# Patient Record
Sex: Female | Born: 1965 | Race: White | Hispanic: No | Marital: Married | Smoking: Former smoker
Health system: Southern US, Community
[De-identification: ages and names within clinical notes are randomized; demographics above are authoritative.]

## PROBLEM LIST (undated history)

## (undated) DIAGNOSIS — B001 Herpesviral vesicular dermatitis: Secondary | ICD-10-CM

## (undated) DIAGNOSIS — N393 Stress incontinence (female) (male): Secondary | ICD-10-CM

## (undated) HISTORY — DX: Herpesviral vesicular dermatitis: B00.1

## (undated) HISTORY — PX: TUBAL LIGATION: SHX77

## (undated) HISTORY — DX: Stress incontinence (female) (male): N39.3

## (undated) HISTORY — PX: BREAST CYST ASPIRATION: SHX578

## (undated) HISTORY — PX: ABDOMINAL HYSTERECTOMY: SHX81

---

## 2008-06-16 ENCOUNTER — Ambulatory Visit: Payer: Self-pay | Admitting: Family Medicine

## 2008-06-26 ENCOUNTER — Ambulatory Visit: Payer: Self-pay | Admitting: Family Medicine

## 2009-10-01 ENCOUNTER — Ambulatory Visit: Payer: Self-pay | Admitting: Family Medicine

## 2009-10-08 ENCOUNTER — Ambulatory Visit: Payer: Self-pay | Admitting: Family Medicine

## 2009-10-08 ENCOUNTER — Ambulatory Visit: Payer: Self-pay | Admitting: Unknown Physician Specialty

## 2009-10-30 ENCOUNTER — Ambulatory Visit: Payer: Self-pay | Admitting: Unknown Physician Specialty

## 2009-11-04 ENCOUNTER — Ambulatory Visit: Payer: Self-pay | Admitting: Unknown Physician Specialty

## 2010-04-04 HISTORY — PX: TUBAL LIGATION: SHX77

## 2010-09-21 ENCOUNTER — Ambulatory Visit: Payer: Self-pay | Admitting: Gastroenterology

## 2011-02-03 ENCOUNTER — Ambulatory Visit: Payer: Self-pay | Admitting: Family Medicine

## 2012-02-07 ENCOUNTER — Ambulatory Visit: Payer: Self-pay | Admitting: Family Medicine

## 2012-11-26 ENCOUNTER — Ambulatory Visit (INDEPENDENT_AMBULATORY_CARE_PROVIDER_SITE_OTHER): Payer: BC Managed Care – PPO | Admitting: Surgery

## 2012-11-26 ENCOUNTER — Telehealth (INDEPENDENT_AMBULATORY_CARE_PROVIDER_SITE_OTHER): Payer: Self-pay | Admitting: Surgery

## 2012-11-26 ENCOUNTER — Encounter (INDEPENDENT_AMBULATORY_CARE_PROVIDER_SITE_OTHER): Payer: Self-pay | Admitting: Surgery

## 2012-11-26 VITALS — BP 128/80 | HR 80 | Temp 98.2°F | Resp 14 | Ht 64.0 in | Wt 141.4 lb

## 2012-11-26 DIAGNOSIS — K648 Other hemorrhoids: Secondary | ICD-10-CM

## 2012-11-26 DIAGNOSIS — K644 Residual hemorrhoidal skin tags: Secondary | ICD-10-CM | POA: Insufficient documentation

## 2012-11-26 NOTE — Progress Notes (Signed)
Subjective:     Patient ID: Kathryn Guzman, female   DOB: 06-05-1965, 47 y.o.   MRN: [id number]  HPI  Kathryn Guzman  1965/09/08 [id number]  Patient has no care team.  This patient is a 47 y.o.female who presents today for surgical evaluation at the request of self.   Reason for visit: Painful hemorrhoids  Pleasant woman.  History with hemorrhoids since her last pregnancy 14 years ago.  She has many external hemorrhoids.  It is hard to keep the skin clean.  Itching and irritation.  Has been given Proctofoam in the past that is helped a little bit.  She also gets swelling and bleeding of internal hemorrhoids.  Sometimes bowel and can be very painful.  She normally has a bowel movement 2-3 times a day.  Had a colonoscopy five years ago to a sister having stomach/gastrointestinal cancer.  That was normal.  No personal nor family history of GI/colon cancer, inflammatory bowel disease, irritable bowel syndrome, allergy such as Celiac Sprue, dietary/dairy problems, colitis, ulcers nor gastritis.  No recent sick contacts/gastroenteritis.  No travel outside the country.  No changes in diet.  She thinks it has worsened.  It is becoming more debilitating.  She came in to have something done  There are no active problems to display for this patient.   History reviewed. No pertinent past medical history.  Past Surgical History  Procedure Laterality Date  . Tubal ligation  2012    History   Social History  . Marital Status: Married    Spouse Name: N/A    Number of Children: N/A  . Years of Education: N/A   Occupational History  . Not on file.   Social History Main Topics  . Smoking status: Former Smoker    Quit date: 04/05/2007  . Smokeless tobacco: Never Used  . Alcohol Use: No  . Drug Use: No  . Sexual Activity: Not on file   Other Topics Concern  . Not on file   Social History Narrative  . No narrative on file    Family History  Problem Relation Age of Onset  . Cancer  Mother     breast  . Cancer Sister   . Birth defects Sister     stomach    No current outpatient prescriptions on file.   No current facility-administered medications for this visit.     Not on File  BP 128/80  Pulse 80  Temp(Src) 98.2 F (36.8 C) (Temporal)  Resp 14  Ht 5\' 4"  (1.626 m)  Wt 141 lb 6.4 oz (64.139 kg)  BMI 24.26 kg/m2  No results found. '  Review of Systems  Constitutional: Negative for fever, chills, diaphoresis, appetite change and fatigue.  HENT: Negative for ear pain, sore throat, trouble swallowing, neck pain and ear discharge.   Eyes: Negative for photophobia, discharge and visual disturbance.  Respiratory: Negative for cough, choking, chest tightness and shortness of breath.   Cardiovascular: Negative for chest pain and palpitations.  Gastrointestinal: Negative for nausea, vomiting, abdominal pain, diarrhea, constipation, anal bleeding and rectal pain.  Endocrine: Negative for cold intolerance and heat intolerance.  Genitourinary: Negative for dysuria, frequency and difficulty urinating.  Musculoskeletal: Negative for myalgias and gait problem.  Skin: Negative for color change, pallor and rash.  Allergic/Immunologic: Negative for environmental allergies, food allergies and immunocompromised state.  Neurological: Negative for dizziness, speech difficulty, weakness and numbness.  Hematological: Negative for adenopathy.  Psychiatric/Behavioral: Negative for confusion and agitation. The patient is not nervous/anxious.  Objective:   Physical Exam  Constitutional: She is oriented to person, place, and time. She appears well-developed and well-nourished. No distress.  HENT:  Head: Normocephalic.  Mouth/Throat: Oropharynx is clear and moist. No oropharyngeal exudate.  Eyes: Conjunctivae and EOM are normal. Pupils are equal, round, and reactive to light. No scleral icterus.  Neck: Normal range of motion. Neck supple. No tracheal deviation present.   Cardiovascular: Normal rate, regular rhythm and intact distal pulses.   Pulmonary/Chest: Effort normal and breath sounds normal. No stridor. No respiratory distress. She exhibits no tenderness.  Abdominal: Soft. She exhibits no distension and no mass. There is no tenderness. Hernia confirmed negative in the right inguinal area and confirmed negative in the left inguinal area.  Genitourinary: There is no rash or tenderness on the right labia. There is no rash or tenderness on the left labia. No vaginal discharge found.  Exam done with assistance of female Medical Assistant in the room.  Perianal skin clean with good hygiene.  No pruritis.  No pilonidal disease.  No fissure.  No abscess/fistula.    Moderate external hemorrhoids right anterior and left lateral.  Very sensitive and irritated.  Tolerates digital  and anoscopic rectal exam.  Normal sphincter tone.  No rectal masses.  Hemorrhoidal piles Enlarged x3 piles.  Right posterior = right anterior > left lateral  Musculoskeletal: Normal range of motion. She exhibits no tenderness.       Right elbow: She exhibits normal range of motion.       Left elbow: She exhibits normal range of motion.       Right wrist: She exhibits normal range of motion.       Left wrist: She exhibits normal range of motion.       Right hand: Normal strength noted.       Left hand: Normal strength noted.  Lymphadenopathy:       Head (right side): No posterior auricular adenopathy present.       Head (left side): No posterior auricular adenopathy present.    She has no cervical adenopathy.    She has no axillary adenopathy.       Right: No inguinal adenopathy present.       Left: No inguinal adenopathy present.  Neurological: She is alert and oriented to person, place, and time. No cranial nerve deficit. She exhibits normal muscle tone. Coordination normal.  Skin: Skin is warm and dry. No rash noted. She is not diaphoretic. No erythema.  Psychiatric: She has a normal  mood and affect. Her behavior is normal. Judgment and thought content normal.       Assessment:     Internal hemorrhoids with intermittent bleeding and prolapse.  External hemorrhoids with irritation and pain     Plan:     Half of the patient several options.  We could start with banding to see if the internal hemorrhoidal calm down enough.  Another option is to do [facility] ligation/pexy with resection of remaining external hemorrhoids.  She notes the external hemorrhoids are the most frustrating aspect of it and wished to be more aggressive with surgical removal and ligation.  I discussed this with her:   The anatomy & physiology of the anorectal region was discussed.  The pathophysiology of hemorrhoids and differential diagnosis was discussed.  Natural history risks without surgery was discussed.   I stressed the importance of a bowel regimen to have daily soft bowel movements to minimize progression of disease.  Interventions such as sclerotherapy &  banding were discussed.  The patient's symptoms are not adequately controlled by medicines and other non-operative treatments.  I feel the risks & problems of no surgery outweigh the operative risks; therefore, I recommended surgery to treat the hemorrhoids by ligation, pexy, and possible resection.  Risks such as bleeding, infection, need for further treatment, heart attack, death, and other risks were discussed.   I noted a good likelihood this will help address the problem.  Goals of post-operative recovery were discussed as well.  Possibility that this will not correct all symptoms was explained.  Post-operative pain, bleeding, constipation, and other problems after surgery were discussed.  We will work to minimize complications.   Educational handouts further explaining the pathology, treatment options, and bowel regimen were given as well.  Questions were answered.  The patient expresses understanding & wishes to proceed with surgery.

## 2012-11-26 NOTE — Patient Instructions (Addendum)
See the Handout(s) we gave you.  Consider surgery.  Please call our office at [phone] if you wish to schedule surgery or if you have further questions / concerns.   HEMORRHOIDS  The rectum is the last foot of your colon, and it naturally stretches to hold stool.  Hemorrhoidal piles are natural clusters of blood vessels that help the rectum and anal canal stretch to hold stool and allow bowel movements to eliminate feces.   Hemorrhoids are abnormally swollen blood vessels in the rectum.  Too much pressure in the rectum causes hemorrhoids by forcing blood to stretch and bulge the walls of the veins, sometimes even rupturing them.  Hemorrhoids can become like varicose veins you might see on a person's legs.  Most people will develop a flare of hemorrhoids in their lifetime.  When bulging hemorrhoidal veins are irritated, they can swell, burn, itch, cause pain, and bleed.  Most flares will calm down gradually own within a few weeks.  However, once hemorrhoids are created, they are difficult to get rid of completely and tend to flare more easily than the first flare.   Fortunately, good habits and simple medical treatment usually control hemorrhoids well, and surgery is needed only in severe cases. Types of Hemorrhoids:  Internal hemorrhoids usually don't initially hurt or itch; they are deep inside the rectum and usually have no sensation. If they begin to push out (prolapse), pain and burning can occur.  However, internal hemorrhoids can bleed.  Anal bleeding should not be ignored since bleeding could come from a dangerous source like colorectal cancer, so persistent rectal bleeding should be investigated by a doctor, sometimes with a colonoscopy.  External hemorrhoids cause most of the symptoms - pain, burning, and itching. Nonirritated hemorrhoids can look like small skin tags coming out of the anus.   Thrombosed hemorrhoids can form when a hemorrhoid blood vessel bursts and causes the hemorrhoid to  suddenly swell.  A purple blood clot can form in it and become an excruciatingly painful lump at the anus. Because of these unpleasant symptoms, immediate incision and drainage by a surgeon at an office visit can provide much relief of the pain.    PREVENTION Avoiding the most frequent causes listed below will prevent most cases of hemorrhoids: Constipation Hard stools Diarrhea  Constant sitting  Straining with bowel movements Sitting on the toilet for a long time  Severe coughing  episodes Pregnancy / Childbirth  Heavy Lifting  Sometimes avoiding the above triggers is difficult:  How can you avoid sitting all day if you have a seated job? Also, we try to avoid coughing and diarrhea, but sometimes it's beyond your control.  Still, there are some practical hints to help: Keep the anal and genital area clean.  Moistened tissues such as flushable wet wipes are less irritating than toilet paper.  Using irrigating showers or bottle irrigation washing gently cleans this sensitive area.   Avoid dry toilet paper when cleaning after bowel movements.  Marland Kitchen Keep the anal and genital area dry.  Lightly pat the rectal area dry.  Avoid rubbing.  Talcum or baby powders can help GET YOUR STOOLS SOFT.   This is the most important way to prevent irritated hemorrhoids.  Hard stools are like sandpaper to the anorectal canal and will cause more problems.  The goal: ONE SOFT BOWEL MOVEMENT A DAY!  BMs from every other day to 3 times a day is a tolerable range Treat coughing, diarrhea and constipation early since irritated  hemorrhoids may soon follow.  If your main job activity is seated, always stand or walk during your breaks. Make it a point to stand and walk at least 5 minutes every hour and try to shift frequently in your chair to avoid direct rectal pressure.  Always exhale as you strain or lift. Don't hold your breath.  Do not delay or try to prevent a bowel movement when the urge is present. Exercise regularly  (walking or jogging 60 minutes a day) to stimulate the bowels to move. No reading or other activity while on the toilet. If bowel movements take longer than 5 minutes, you are too constipated. AVOID CONSTIPATION Drink plenty of liquids (1 1/2 to 2 quarts of water and other fluids a day unless fluid restricted for another medical condition). Liquids that contain caffeine (coffee a, tea, soft drinks) can be dehydrating and should be avoided until constipation is controlled. Consider minimizing milk, as dairy products may be constipating. Eat plenty of fiber (30g a day ideal, more if needed).  Fiber is the undigested part of plant food that passes into the colon, acting as "natures broom" to encourage bowel motility and movement.  Fiber can absorb and hold large amounts of water. This results in a larger, bulkier stool, which is soft and easier to pass.  Eating foods high in fiber - 12 servings - such as  Vegetables: Root (potatoes, carrots, turnips), Leafy green (lettuce, salad greens, celery, spinach), High residue (cabbage, broccoli, etc.) Fruit: Fresh, Dried (prunes, apricots, cherries), Stewed (applesauce)  Whole grain breads, pasta, whole wheat Bran cereals, muffins, etc. Consider adding supplemental bulking fiber which retains large volumes of water: Psyllium ground seeds (native plant from [place])--available as Metamucil, Konsyl, Effersyllium, Per Diem Fiber, or the less expensive generic forms.  Citrucel  (methylcellulose wood fiber) . FiberCon (Polycarbophil) Polyethylene Glycol - and "artificial" fiber commonly called Miralax or Glycolax.  It is helpful for people with gassy or bloated feelings with regular fiber Flax Seed - a less gassy natural fiber  Laxatives can be useful for a short period if constipation is severe Osmotics (Milk of Magnesia, Fleets Phospho-Soda, Magnesium Citrate)  Stimulants (Senokot,   Castor Oil,  Dulcolax, Ex-Lax)    Laxatives are not a good long-term  solution as it can stress the bowels and cause too much mineral loss and dehydration.   Avoid taking laxatives for more than 7 days in a row.  AVOID DIARRHEA Switch to liquids and simpler foods for a few days to avoid stressing your intestines further. Avoid dairy products (especially milk & ice cream) for a short time.  The intestines often can lose the ability to digest lactose when stressed. Avoid foods that cause gassiness or bloating.  Typical foods include beans and other legumes, cabbage, broccoli, and dairy foods.  Every person has some sensitivity to other foods, so listen to your body and avoid those foods that trigger problems for you. Adding fiber (Citrucel, Metamucil, FiberCon, Flax seed, Miralax) gradually can help thicken stools by absorbing excess fluid and retrain the intestines to act more normally.  Slowly increase the dose over a few weeks.  Too much fiber too soon can backfire and cause cramping & bloating. Probiotics (such as active yogurt, Align, etc) may help repopulate the intestines and colon with normal bacteria and calm down a sensitive digestive tract.  Most studies show it to be of mild help, though, and such products can be costly. Medicines: Bismuth subsalicylate (ex. Kayopectate, Pepto Bismol) every 30  minutes for up to 6 doses can help control diarrhea.  Avoid if pregnant. Loperamide (Immodium) can slow down diarrhea.  Start with two tablets (4mg  total) first and then try one tablet every 6 hours.  Avoid if you are having fevers or severe pain.  If you are not better or start feeling worse, stop all medicines and call your doctor for advice Call your doctor if you are getting worse or not better.  Sometimes further testing (cultures, endoscopy, X-ray studies, bloodwork, etc) may be needed to help diagnose and treat the cause of the diarrhea. TREATMENT OF HEMORRHOID FLARE If these preventive measures fail, you must take action right away! Hemorrhoids are one condition  that can be mild in the morning and become intolerable by nightfall. Most hemorrhoidal flares take several weeks to calm down.  These suggestions can help: Warm soaks.  This helps more than any topical medication.  Use up to 8 times a day.  Usually sitz baths or sitting in a warm bathtub helps.  Sitting on moist warm towels are helpful.  Switching to ice packs/cool compresses can be helpful Normalize your bowels.  Extremes of diarrhea or constipation will make hemorrhoids worse.  One soft bowel movement a day is the goal.  Fiber can help get your bowels regular Wet wipes instead of toilet paper Pain control with a NSAID such as ibuprofen (Advil) or naproxen (Aleve) or acetaminophen (Tylenol) around the clock.  Narcotics are constipating and should be minimized if possible Topical creams contain steroids (bydrocortisone) or local anesthetic (xylocaine) can help make pain and itching more tolerable.   EVALUATION If hemorrhoids are still causing problems, you could benefit by an evaluation by a surgeon.  The surgeon will obtain a history and examine you.  If hemorrhoids are diagnosed, some therapies can be offered in the office, usually with an anoscope into the less sensitive area of the rectum: -injection of hemorrhoids (sclerotherapy) can scar the blood vessels of the swollen/enlarged hemorrhoids to help shrink them down to a more normal size -rubber banding of the enlarged hemorrhoids to help shrink them down to a more normal size -drainage of the blood clot causing a thrombosed hemorrhoid,  to relieve the severe pain   While 90% of the time such problems from hemorrhoids can be managed without preceding to surgery, sometimes the hemorrhoids require a operation to control the problem (uncontrolled bleeding, prolapse, pain, etc.).   This involves being placed under general anesthesia where the surgeon can confirm the diagnosis and remove, suture, or staple the hemorrhoid(s).  Your surgeon can help you  treat the problem appropriately.    ANORECTAL SURGERY: POST OP INSTRUCTIONS  1. Take your usually prescribed home medications unless otherwise directed. 2. DIET: Follow a light bland diet the first 24 hours after arrival home, such as soup, liquids, crackers, etc.  Be sure to include lots of fluids daily.  Avoid fast food or heavy meals as your are more likely to get nauseated.  Eat a low fat the next few days after surgery.   3. PAIN CONTROL: a. Pain is best controlled by a usual combination of three different methods TOGETHER: i. Ice/Heat ii. Over the counter pain medication iii. Prescription pain medication b. Most patients will experience some swelling and discomfort in the anus/rectal area. and incisions.  Ice packs or heat (30-60 minutes up to 6 times a day) will help. Use ice for the first few days to help decrease swelling and bruising, then switch to heat such  as warm towels, sitz baths, warm baths, etc to help relax tight/sore spots and speed recovery.  Some people prefer to use ice alone, heat alone, alternating between ice & heat.  Experiment to what works for you.  Swelling and bruising can take several weeks to resolve.   c. It is helpful to take an over-the-counter pain medication regularly for the first few weeks.  Choose one of the following that works best for you: i. Naproxen (Aleve, etc)  Two 220mg  tabs twice a day ii. Ibuprofen (Advil, etc) Three 200mg  tabs four times a day (every meal & bedtime) iii. Acetaminophen (Tylenol, etc) 500-650mg  four times a day (every meal & bedtime) d. A  prescription for pain medication (such as oxycodone, hydrocodone, etc) should be given to you upon discharge.  Take your pain medication as prescribed.  i. If you are having problems/concerns with the prescription medicine (does not control pain, nausea, vomiting, rash, itching, etc), please call us [phone] to see if we need to switch you to a different pain medicine that will work better  for you and/or control your side effect better. ii. If you need a refill on your pain medication, please contact your pharmacy.  They will contact our office to request authorization. Prescriptions will not be filled after 5 pm or on week-ends. 4. KEEP YOUR BOWELS REGULAR a. The goal is one bowel movement a day b. Avoid getting constipated.  Between the surgery and the pain medications, it is common to experience some constipation.  Increasing fluid intake and taking a fiber supplement (such as Metamucil, Citrucel, FiberCon, MiraLax, etc) 1-2 times a day regularly will usually help prevent this problem from occurring.  A mild laxative (prune juice, Milk of Magnesia, MiraLax, etc) should be taken according to package directions if there are no bowel movements after 48 hours. c. Watch out for diarrhea.  If you have many loose bowel movements, simplify your diet to bland foods & liquids for a few days.  Stop any stool softeners and decrease your fiber supplement.  Switching to mild anti-diarrheal medications (Kayopectate, Pepto Bismol) can help.  If this worsens or does not improve, please call us.  5. Wound Care a. Remove your bandages the day after surgery.  Unless discharge instructions indicate otherwise, leave your bandage dry and in place overnight.  Remove the bandage during your first bowel movement.   b. Allow the wound packing to fall out over the next few days.  You can trim exposed gauze / ribbon as it falls out.  You do not need to repack the wound unless instructed otherwise.  Wear an absorbent pad or soft cotton gauze in your underwear as needed to catch any drainage and help keep the area  c. Keep the area clean and dry.  Bathe / shower every day.  Keep the area clean by showering / bathing over the incision / wound.   It is okay to soak an open wound to help wash it.  Wet wipes or showers / gentle washing after bowel movements is often less traumatic than regular toilet paper. d. Bonita Quin may  have some styrofoam-like soft packing in the rectum which will come out with the first bowel movement.  e. You will often notice bleeding with bowel movements.  This should slow down by the end of the first week of surgery f. Expect some drainage.  This should slow down, too, by the end of the first week of surgery.  Wear an absorbent pad  or soft cotton gauze in your underwear until the drainage stops. 6. ACTIVITIES as tolerated:   a. You may resume regular (light) daily activities beginning the next day-such as daily self-care, walking, climbing stairs-gradually increasing activities as tolerated.  If you can walk 30 minutes without difficulty, it is safe to try more intense activity such as jogging, treadmill, bicycling, low-impact aerobics, swimming, etc. b. Save the most intensive and strenuous activity for last such as sit-ups, heavy lifting, contact sports, etc  Refrain from any heavy lifting or straining until you are off narcotics for pain control.   c. DO NOT PUSH THROUGH PAIN.  Let pain be your guide: If it hurts to do something, don't do it.  Pain is your body warning you to avoid that activity for another week until the pain goes down. d. You may drive when you are no longer taking prescription pain medication, you can comfortably sit for long periods of time, and you can safely maneuver your car and apply brakes. e. Bonita Quin may have sexual intercourse when it is comfortable.  7. FOLLOW UP in our office a. Please call [facility] at [phone] to set up an appointment to see your surgeon in the office for a follow-up appointment approximately 2 weeks after your surgery. b. Make sure that you call for this appointment the day you arrive home to insure a convenient appointment time. 10. IF YOU HAVE DISABILITY OR FAMILY LEAVE FORMS, BRING THEM TO THE OFFICE FOR PROCESSING.  DO NOT GIVE THEM TO YOUR DOCTOR.        WHEN TO CALL us [phone]: 1. Poor pain control 2. Reactions / problems with  new medications (rash/itching, nausea, etc)  3. Fever over 101.5 F (38.5 C) 4. Inability to urinate 5. Nausea and/or vomiting 6. Worsening swelling or bruising 7. Continued bleeding from incision. 8. Increased pain, redness, or drainage from the incision  The clinic staff is available to answer your questions during regular business hours (8:30am-5pm).  Please don't hesitate to call and ask to speak to one of our nurses for clinical concerns.   A surgeon from [facility] is always on call at the hospitals   If you have a medical emergency, go to the nearest emergency room or call 911.    [facility] [address], [place], [place]  [address] ? MAIN: [phone] ? TOLL FREE: [phone] ? FAX [phone] [email or web address]  GETTING TO GOOD BOWEL HEALTH. Irregular bowel habits such as constipation and diarrhea can lead to many problems over time.  Having one soft bowel movement a day is the most important way to prevent further problems.  The anorectal canal is designed to handle stretching and feces to safely manage our ability to get rid of solid waste (feces, poop, stool) out of our body.  BUT, hard constipated stools can act like ripping concrete bricks and diarrhea can be a burning fire to this very sensitive area of our body, causing inflamed hemorrhoids, anal fissures, increasing risk is perirectal abscesses, abdominal pain/bloating, an making irritable bowel worse.     The goal: ONE SOFT BOWEL MOVEMENT A DAY!  To have soft, regular bowel movements:    Drink at least 8 tall glasses of water a day.     Take plenty of fiber.  Fiber is the undigested part of plant food that passes into the colon, acting s "natures broom" to encourage bowel motility and movement.  Fiber can absorb and  hold large amounts of water. This results in a larger, bulkier stool, which is soft and easier to pass. Work gradually over several weeks up to  6 servings a day of fiber (25g a day even more if needed) in the form of: o Vegetables -- Root (potatoes, carrots, turnips), leafy green (lettuce, salad greens, celery, spinach), or cooked high residue (cabbage, broccoli, etc) o Fruit -- Fresh (unpeeled skin & pulp), Dried (prunes, apricots, cherries, etc ),  or stewed ( applesauce)  o Whole grain breads, pasta, etc (whole wheat)  o Bran cereals    Bulking Agents -- This type of water-retaining fiber generally is easily obtained each day by one of the following:  o Psyllium bran -- The psyllium plant is remarkable because its ground seeds can retain so much water. This product is available as Metamucil, Konsyl, Effersyllium, Per Diem Fiber, or the less expensive generic preparation in drug and health food stores. Although labeled a laxative, it really is not a laxative.  o Methylcellulose -- This is another fiber derived from wood which also retains water. It is available as Citrucel. o Polyethylene Glycol - and "artificial" fiber commonly called Miralax or Glycolax.  It is helpful for people with gassy or bloated feelings with regular fiber o Flax Seed - a less gassy fiber than psyllium   No reading or other relaxing activity while on the toilet. If bowel movements take longer than 5 minutes, you are too constipated   AVOID CONSTIPATION.  High fiber and water intake usually takes care of this.  Sometimes a laxative is needed to stimulate more frequent bowel movements, but    Laxatives are not a good long-term solution as it can wear the colon out. o Osmotics (Milk of Magnesia, Fleets phosphosoda, Magnesium citrate, MiraLax, GoLytely) are safer than  o Stimulants (Senokot, Castor Oil, Dulcolax, Ex Lax)    o Do not take laxatives for more than 7days in a row.    IF SEVERELY CONSTIPATED, try a Bowel Retraining Program: o Do not use laxatives.  o Eat a diet high in roughage, such as bran cereals and leafy vegetables.  o Drink six (6) ounces of prune  or apricot juice each morning.  o Eat two (2) large servings of stewed fruit each day.  o Take one (1) heaping tablespoon of a psyllium-based bulking agent twice a day. Use sugar-free sweetener when possible to avoid excessive calories.  o Eat a normal breakfast.  o Set aside 15 minutes after breakfast to sit on the toilet, but do not strain to have a bowel movement.  o If you do not have a bowel movement by the third day, use an enema and repeat the above steps.    Controlling diarrhea o Switch to liquids and simpler foods for a few days to avoid stressing your intestines further. o Avoid dairy products (especially milk & ice cream) for a short time.  The intestines often can lose the ability to digest lactose when stressed. o Avoid foods that cause gassiness or bloating.  Typical foods include beans and other legumes, cabbage, broccoli, and dairy foods.  Every person has some sensitivity to other foods, so listen to our body and avoid those foods that trigger problems for you. o Adding fiber (Citrucel, Metamucil, psyllium, Miralax) gradually can help thicken stools by absorbing excess fluid and retrain the intestines to act more normally.  Slowly increase the dose over a few weeks.  Too much fiber too soon can backfire and cause  cramping & bloating. o Probiotics (such as active yogurt, Align, etc) may help repopulate the intestines and colon with normal bacteria and calm down a sensitive digestive tract.  Most studies show it to be of mild help, though, and such products can be costly. o Medicines:   Bismuth subsalicylate (ex. Kayopectate, Pepto Bismol) every 30 minutes for up to 6 doses can help control diarrhea.  Avoid if pregnant.   Loperamide (Immodium) can slow down diarrhea.  Start with two tablets (4mg  total) first and then try one tablet every 6 hours.  Avoid if you are having fevers or severe pain.  If you are not better or start feeling worse, stop all medicines and call your doctor for  advice o Call your doctor if you are getting worse or not better.  Sometimes further testing (cultures, endoscopy, X-ray studies, bloodwork, etc) may be needed to help diagnose and treat the cause of the diarrhea. o

## 2012-11-26 NOTE — Telephone Encounter (Signed)
Met with surgery scheduling, went over financial responsibilities, face sheet in pending folder.

## 2013-03-17 IMAGING — MG MM CAD DIAGNOSTIC MAMMO
1 series · 6 of 6 positions shown · non-contrast
Comparison: none

REASON FOR EXAM: fu cat 4 nodule and yealry
COMMENTS:

[Series 5390: R CC · right · 6 of 6 slices shown]
[im 1/6]
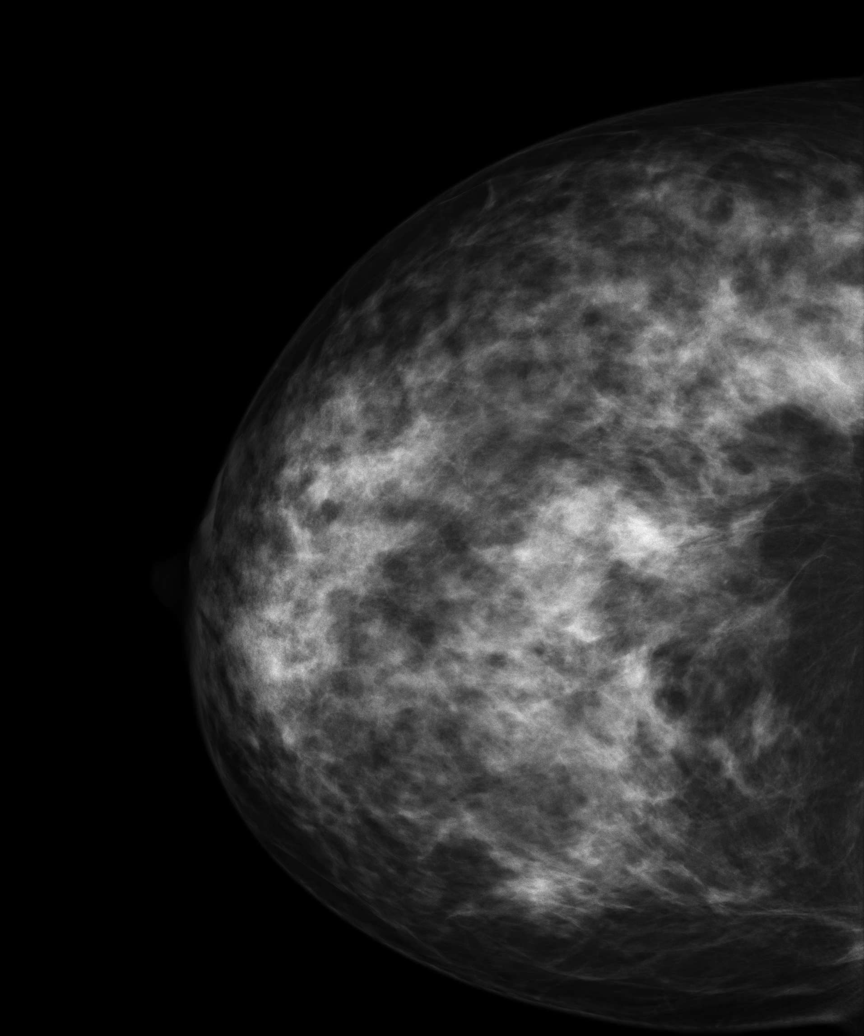
[im 2/6]
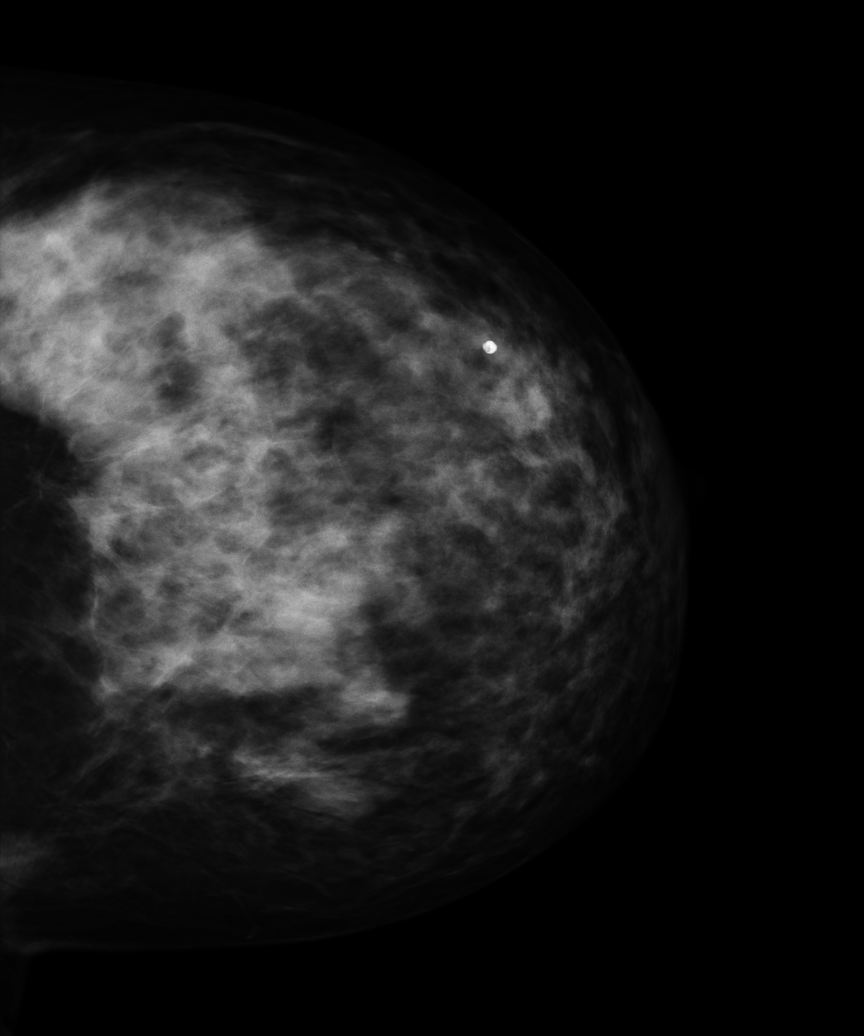
[im 3/6]
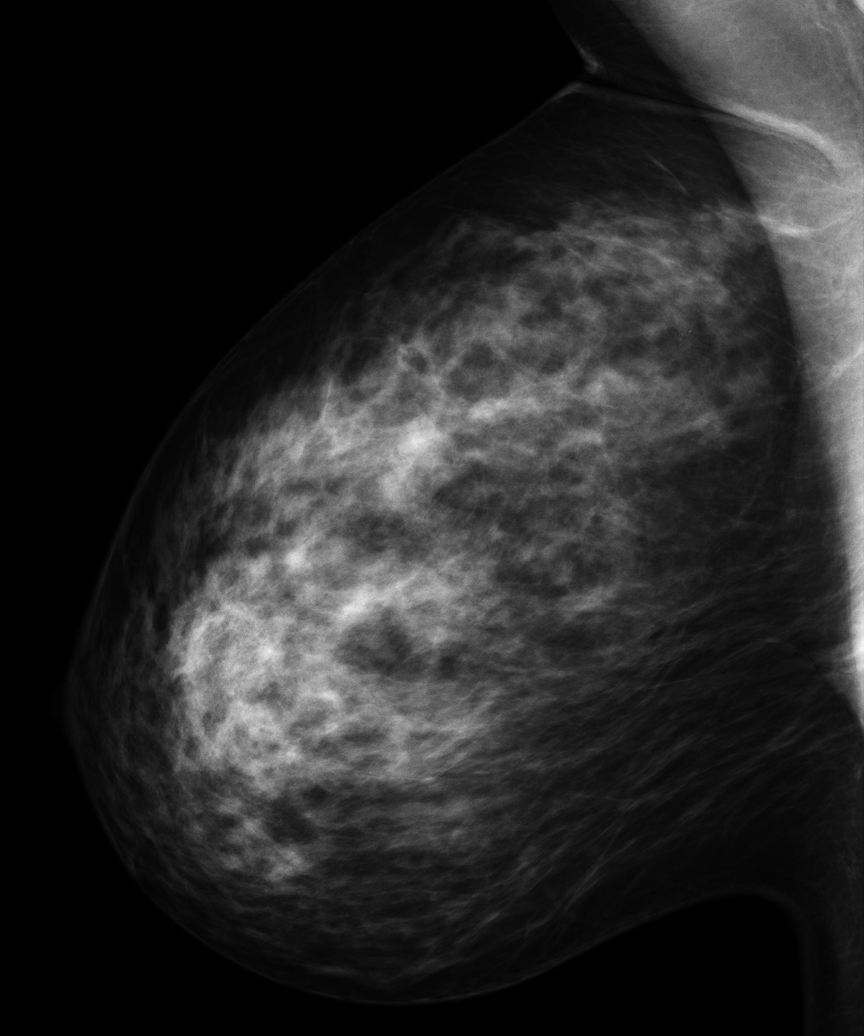
[im 4/6]
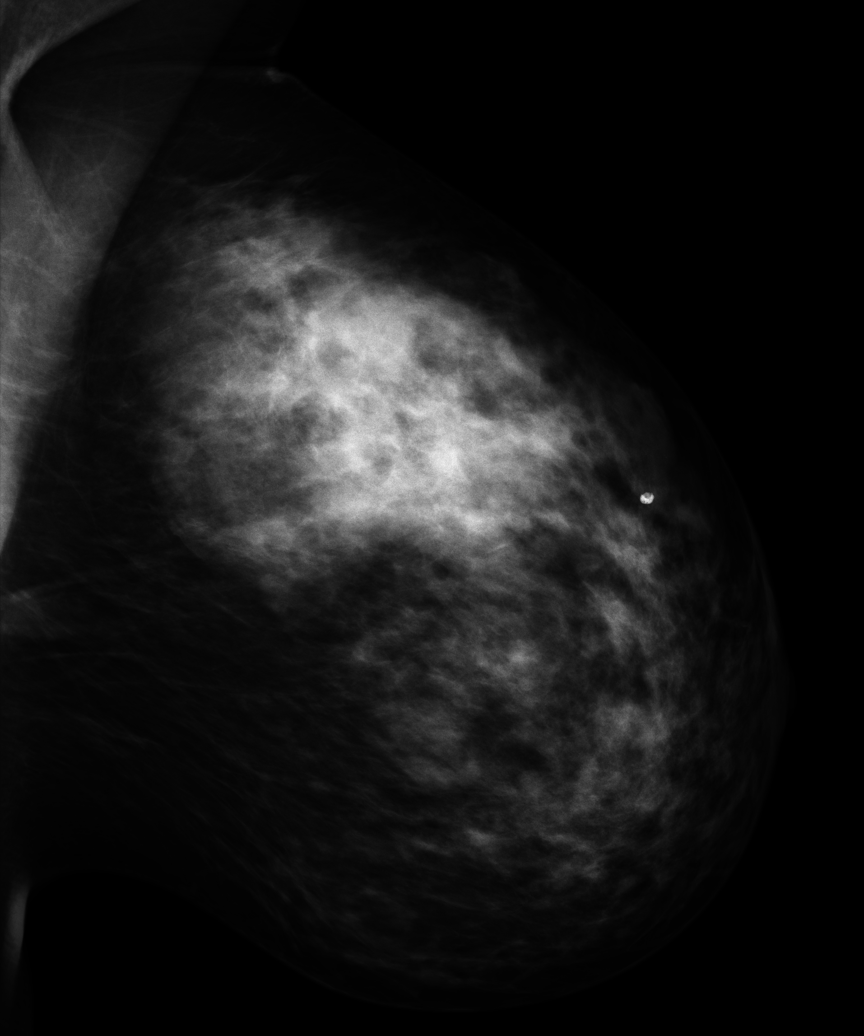
[im 5/6]
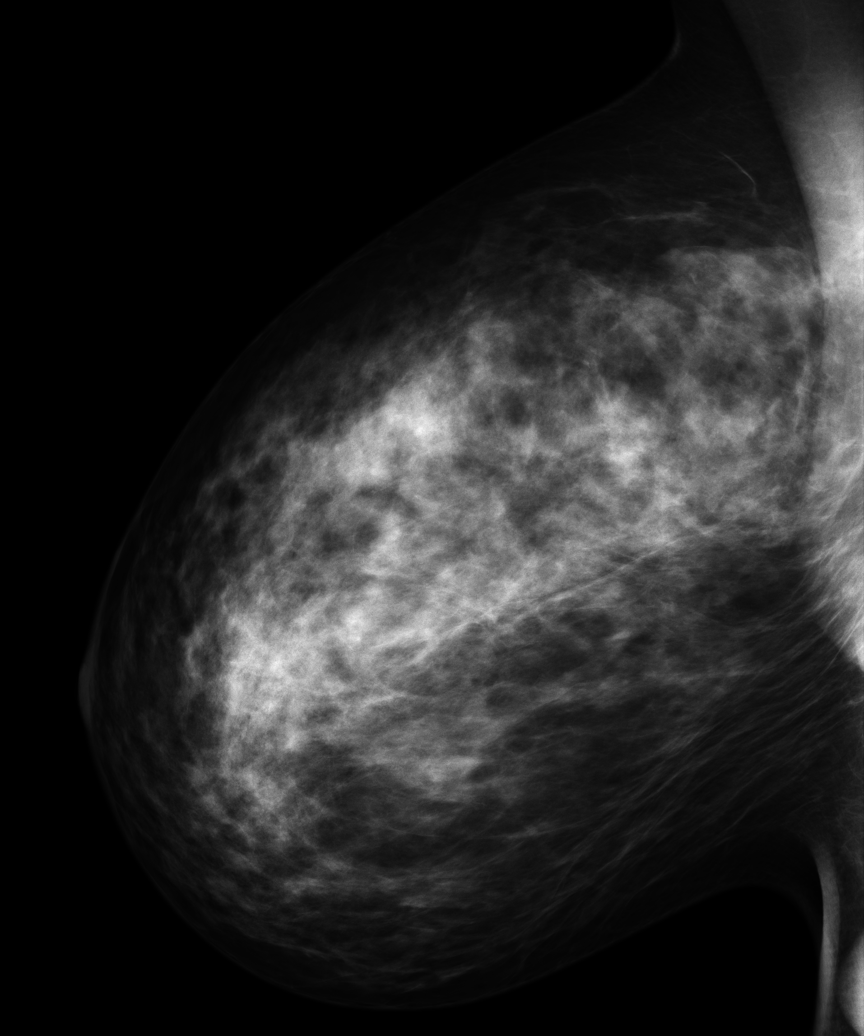
[im 6/6]
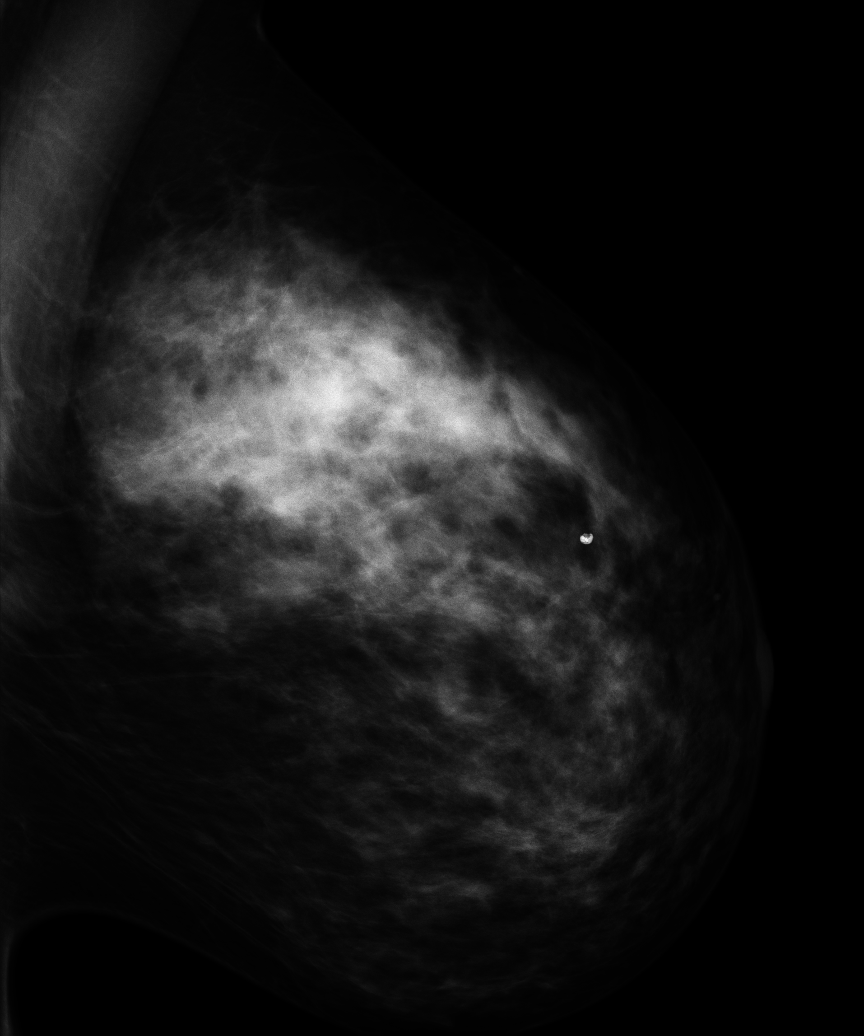

[6 of 6 positions shown; findings below may reference images not displayed]

PROCEDURE:     MAM - MAM DGTL DIAGNOSTIC MAMMO W/CAD  - February 03, 2011  [DATE]

RESULT:     Comparison is made to prior examinations October 01, 2009,June 26, 2008 and September 19, 2003.

The previously noted mass located deep in the medial aspect of the left
breast at 9 o'clock is again seen. The mass has reportedly been biopsied in
the interval since the previous exam utilizing fine needle aspiration
technique with a benign result. The mass again measures approximately 17 mm
at maximum diameter. The margins remain smooth. No new masses are
identified. The breast parenchyma is bilaterally dense which lowers the
sensitivity of mammography. No malignant appearing calcifications are seen.
IMPRESSION: 1.  Persistent mass deep in the left breast at 9 o'clock and best seen in
the MLO view on the current exam. As mentioned above, this has been biopsied
since the prior exam with benign result.
2.  No new masses suspicious for malignancy are seen.
3.  No malignant appearing microcalcifications are seen.
4.  Continued annual mammography or annual screening mammography is
recommended.
5.  BI-RADS: Category 2 - Benign Findings.

Thank you for this opportunity to contribute to the care of your patient.

A NEGATIVE MAMMOGRAM REPORT DOES NOT PRECLUDE BIOPSY OR OTHER EVALUATION OF
A CLINICALLY PALPABLE OR OTHERWISE SUSPICIOUS MASS OR LESION. BREAST CANCER
MAY NOT BE DETECTED BY MAMMOGRAPHY IN UP TO 10% OF CASES.

## 2013-03-20 ENCOUNTER — Ambulatory Visit: Payer: Self-pay | Admitting: Family Medicine

## 2014-03-21 IMAGING — MG MM CAD SCREENING MAMMO
1 series · 4 of 4 positions shown · non-contrast
Comparison: none

REASON FOR EXAM: SCR MAMMO NO ORDER
COMMENTS:

[R CC · right · 4 of 4 slices shown]
[im 1/4]
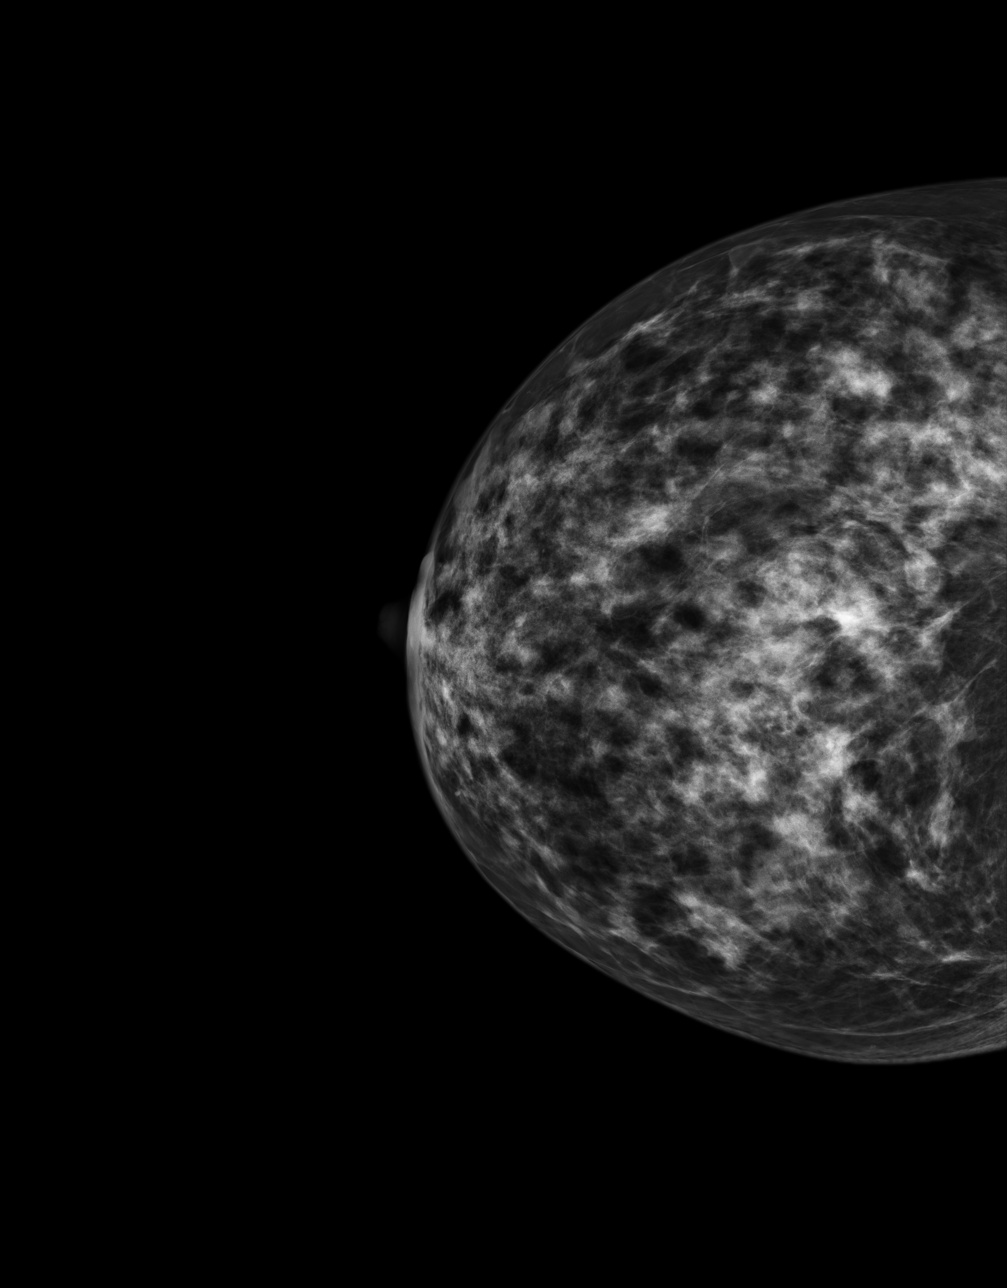
[im 2/4]
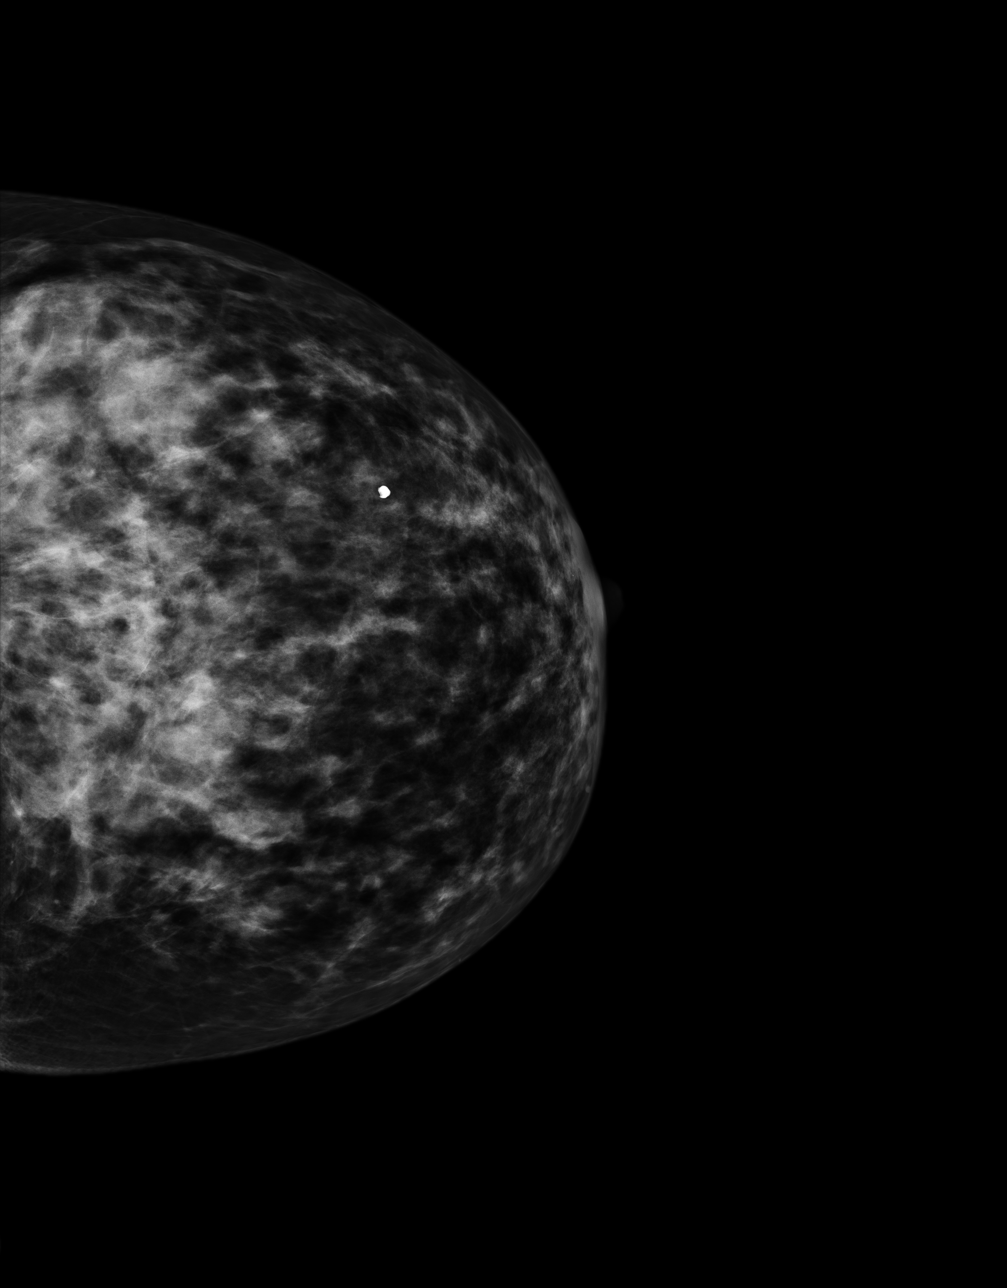
[im 3/4]
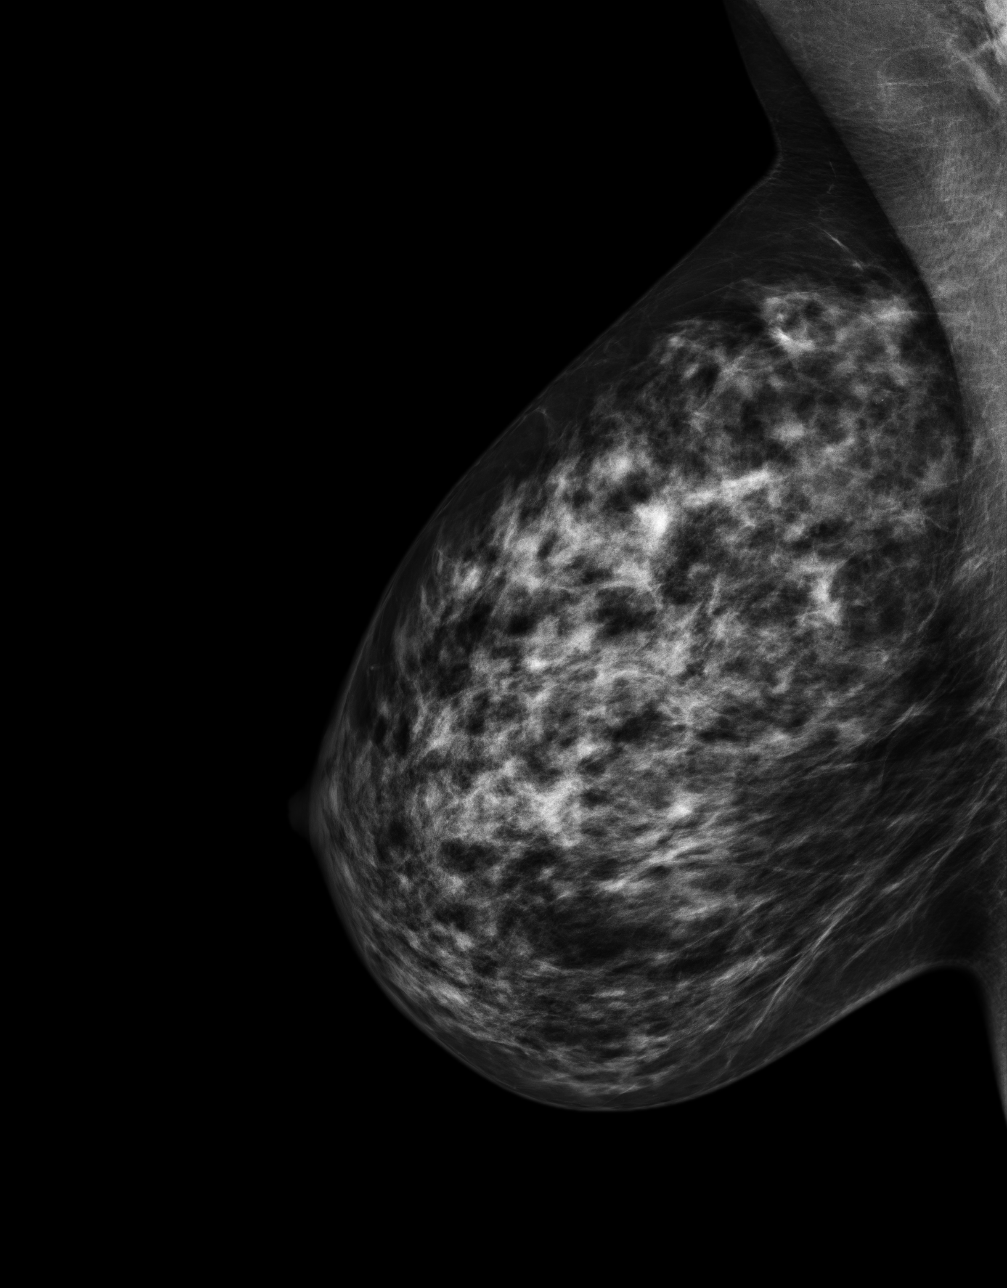
[im 4/4]
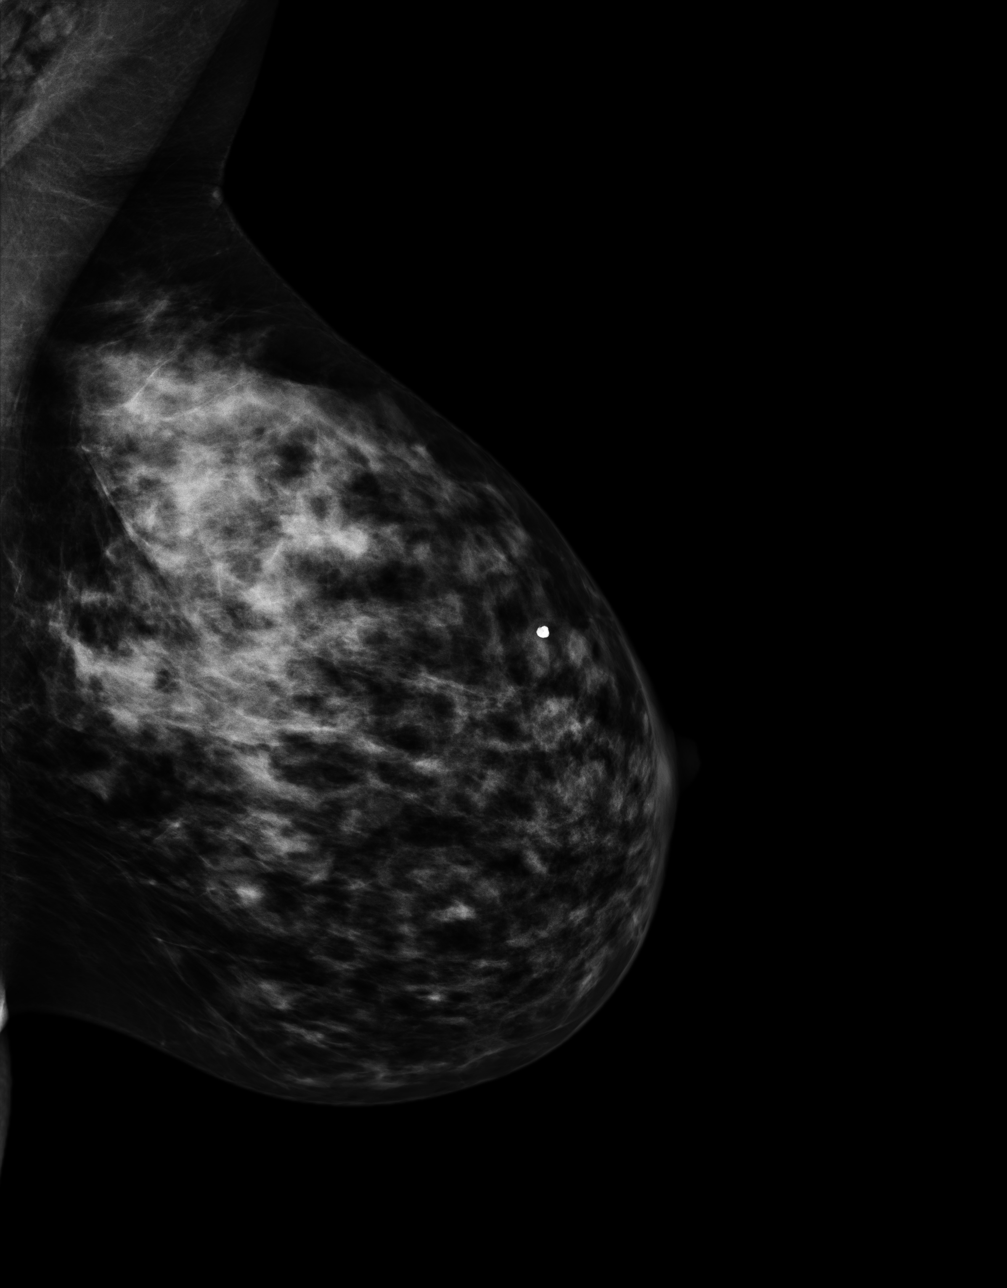

[4 of 4 positions shown; findings below may reference images not displayed]

PROCEDURE:     MAM - MAM DGTL SCRN MAM NO ORDER W/CAD  - February 07, 2012  [DATE]

RESULT:     Comparison is made to previous digital studies February 03, 2011,October 01, 2009, and June 26, 2008.

The breasts exhibit a heterogeneously dense nodular parenchymal pattern.
There is a stable coarse macrocalcification superolaterally on the left.
There is no dominant mass and there are no malignant appearing groupings of
microcalcification.
IMPRESSION: There are no findings suspicious for malignancy.

BI-RADS 2: Benign findings. The breast parenchyma is quite dense and the
sensitivity of mammography is reduced in the face of such dense
fibroglandular elements.

Recommendation: please continue to encourage yearly mammographic followup.

A NEGATIVE MAMMOGRAM REPORT DOES NOT PRECLUDE BIOPSY OR OTHER EVALUATION OF
A CLINICALLY PALPABLE OR OTHERWISE SUSPICIOUS MASS OR LESION. BREAST CANCER
MAY NOT BE DETECTED BY MAMMOGRAPHY IN UP TO 10% OF CASES.

Dictation site:1

## 2014-04-10 ENCOUNTER — Ambulatory Visit: Payer: Self-pay | Admitting: Family Medicine

## 2014-12-03 ENCOUNTER — Ambulatory Visit: Payer: Self-pay | Admitting: Family Medicine

## 2014-12-05 ENCOUNTER — Encounter: Payer: Self-pay | Admitting: Family Medicine

## 2014-12-05 ENCOUNTER — Ambulatory Visit (INDEPENDENT_AMBULATORY_CARE_PROVIDER_SITE_OTHER): Payer: PRIVATE HEALTH INSURANCE | Admitting: Family Medicine

## 2014-12-05 VITALS — BP 124/70 | HR 98 | Temp 97.9°F | Resp 16 | Ht 65.0 in | Wt 162.8 lb

## 2014-12-05 DIAGNOSIS — B001 Herpesviral vesicular dermatitis: Secondary | ICD-10-CM

## 2014-12-05 DIAGNOSIS — B0089 Other herpesviral infection: Secondary | ICD-10-CM | POA: Insufficient documentation

## 2014-12-05 MED ORDER — VALACYCLOVIR HCL 500 MG PO TABS: 500.0000 mg | ORAL_TABLET | Freq: Two times a day (BID) | ORAL | Status: DC

## 2014-12-05 NOTE — Progress Notes (Signed)
Name: Kathryn Guzman   MRN: [id number]    DOB: 20-Sep-1965   Date:12/05/2014       Progress Note  Subjective  Chief Complaint  Chief Complaint  Patient presents with  . Establish Care    NP (Dr. Carman Ching pt)    HPI   Pt. With long-standing history of oral Herpes, described as 'fever blisters' around her mouth and lipis, especially in heat or when she gets stressed. She takes Valtrex at the onset which usually relieves her symptoms.  Past Medical History  Diagnosis Date  . Herpes labialis     Past Surgical History  Procedure Laterality Date  . Tubal ligation  2012  . Abdominal hysterectomy    . Tubal ligation      Family History  Problem Relation Age of Onset  . Birth defects Sister     stomach  . Cancer Sister     skin cancer  . Cancer Maternal Grandmother     Breast Cancer    Social History   Social History  . Marital Status: Married    Spouse Name: N/A  . Number of Children: N/A  . Years of Education: N/A   Occupational History  . Not on file.   Social History Main Topics  . Smoking status: Former Smoker    Quit date: 04/05/2007  . Smokeless tobacco: Never Used  . Alcohol Use: No  . Drug Use: No  . Sexual Activity: Yes   Other Topics Concern  . Not on file   Social History Narrative     Current outpatient prescriptions:  .  valACYclovir (VALTREX) 500 MG tablet, Take by mouth., Disp: , Rfl:   No Known Allergies   Review of Systems  Constitutional: Negative for fever and chills.  Skin: Negative for itching and rash.   Objective  Filed Vitals:   12/05/14 0831  BP: 124/70  Pulse: 98  Temp: 97.9 F (36.6 C)  TempSrc: Oral  Resp: 16  Height:  (1.651 m)  Weight: 162 lb 12.8 oz (73.846 kg)  SpO2: 98%    Physical Exam  Constitutional: She is oriented to person, place, and time and well-developed, well-nourished, and in no distress.  HENT:  Head: Normocephalic and atraumatic.  Cardiovascular: Normal rate and regular rhythm.    Pulmonary/Chest: Effort normal and breath sounds normal.  Abdominal: Soft. Bowel sounds are normal.  Neurological: She is alert and oriented to person, place, and time.  Nursing note and vitals reviewed.  Assessment & Plan  1. Herpes labialis  Refill for Valtrex provided to be taken at the onset of symptoms.  - valACYclovir (VALTREX) 500 MG tablet; Take 1 tablet (500 mg total) by mouth 2 (two) times daily.  Dispense: 60 tablet; Refill: 0   Paddy Neis Asad A. [facility] 12/05/2014 9:18 AM

## 2015-01-16 ENCOUNTER — Ambulatory Visit (INDEPENDENT_AMBULATORY_CARE_PROVIDER_SITE_OTHER): Payer: PRIVATE HEALTH INSURANCE | Admitting: Family Medicine

## 2015-01-16 ENCOUNTER — Encounter: Payer: Self-pay | Admitting: Family Medicine

## 2015-01-16 ENCOUNTER — Other Ambulatory Visit: Payer: Self-pay | Admitting: Family Medicine

## 2015-01-16 VITALS — BP 132/70 | HR 116 | Temp 97.9°F | Resp 20 | Ht 65.0 in | Wt 163.8 lb

## 2015-01-16 DIAGNOSIS — R32 Unspecified urinary incontinence: Secondary | ICD-10-CM

## 2015-01-16 DIAGNOSIS — Z0000 Encounter for general adult medical examination without abnormal findings: Secondary | ICD-10-CM

## 2015-01-16 DIAGNOSIS — Z23 Encounter for immunization: Secondary | ICD-10-CM | POA: Diagnosis not present

## 2015-01-16 NOTE — Progress Notes (Signed)
Name: Kathryn Guzman   MRN: [id number]    DOB: 1965/04/07   Date:01/16/2015       Progress Note  Subjective  Chief Complaint  Chief Complaint  Patient presents with  . Annual Exam    CPE w/ pap   HPI  Pt. Is here for an Annual Physical exam with Pap Smear. Last Pap Smear and Mammogram in November 2015, both within normal limits.  Past Medical History  Diagnosis Date  . Herpes labialis     Past Surgical History  Procedure Laterality Date  . Tubal ligation  2012  . Abdominal hysterectomy    . Tubal ligation      Family History  Problem Relation Age of Onset  . Birth defects Sister     stomach  . Cancer Sister     skin cancer  . Cancer Maternal Grandmother     Breast Cancer   Social History   Social History  . Marital Status: Married    Spouse Name: N/A  . Number of Children: N/A  . Years of Education: N/A   Occupational History  . Not on file.   Social History Main Topics  . Smoking status: Former Smoker    Quit date: 04/05/2007  . Smokeless tobacco: Never Used  . Alcohol Use: No  . Drug Use: No  . Sexual Activity: Yes   Other Topics Concern  . Not on file   Social History Narrative    Current outpatient prescriptions:  .  valACYclovir (VALTREX) 500 MG tablet, Take 1 tablet (500 mg total) by mouth 2 (two) times daily., Disp: 60 tablet, Rfl: 0  No Known Allergies  Review of Systems  Constitutional: Negative for fever, chills, weight loss and malaise/fatigue.  HENT: Negative for congestion, ear pain and sore throat.   Eyes: Negative for blurred vision, double vision and pain.  Respiratory: Negative for cough, shortness of breath and wheezing.   Cardiovascular: Negative for chest pain, palpitations and leg swelling.  Gastrointestinal: Negative for heartburn, nausea, vomiting, abdominal pain, diarrhea, constipation and blood in stool.  Genitourinary: Negative for dysuria, urgency and hematuria.       Urinary leakage intermittently, which has  gotten somewhat worse in the last year.  Musculoskeletal: Negative for myalgias, back pain and joint pain.  Skin: Negative for itching and rash.  Neurological: Negative for dizziness, seizures and headaches.  Endo/Heme/Allergies: Negative for environmental allergies and polydipsia. Does not bruise/bleed easily.  Psychiatric/Behavioral: Negative for depression. The patient is not nervous/anxious and does not have insomnia.    Objective  Filed Vitals:   01/16/15 1001  BP: 132/70  Pulse: 116  Temp: 97.9 F (36.6 C)  TempSrc: Oral  Resp: 20  Height:  (1.651 m)  Weight: 163 lb 12.8 oz (74.299 kg)  SpO2: 92%    Physical Exam  Constitutional: She is oriented to person, place, and time and well-developed, well-nourished, and in no distress.  HENT:  Head: Normocephalic and atraumatic.  Right Ear: Tympanic membrane and ear canal normal.  Left Ear: Tympanic membrane and ear canal normal.  Mouth/Throat: No posterior oropharyngeal erythema.  Eyes: Conjunctivae are normal.  Neck: Neck supple.  Cardiovascular: Regular rhythm and normal heart sounds.  Tachycardia present.   No murmur heard. Pulmonary/Chest: Breath sounds normal. She has no wheezes. She has no rales. Right breast exhibits no inverted nipple, no mass, no nipple discharge, no skin change and no tenderness. Left breast exhibits no inverted nipple, no mass, no nipple discharge, no skin  change and no tenderness.    At 8 o clock in the deeper right breast, an area of concern (feels cystic like) on palpation. No true mass palpated.  Abdominal: Soft. Bowel sounds are normal. There is no tenderness.  Genitourinary: Vagina normal, uterus normal, cervix normal, right adnexa normal and left adnexa normal.  Neurological: She is alert and oriented to person, place, and time.  Skin: Skin is warm and dry.  Psychiatric: Mood, memory, affect and judgment normal.  Nursing note and vitals reviewed.  Assessment & Plan  1. Need for  immunization against influenza  - Flu Vaccine QUAD 36+ mos PF IM (Fluarix & Fluzone Quad PF)  2. Annual physical exam Obtain a diagnostic mammogram for right breast to evaluate an area of concern. - CBC with Differential - Comprehensive Metabolic Panel (CMET) - Cytology - PAP - TSH - Vitamin D (25 hydroxy) - MM Digital Screening; Future - MM Digital Diagnostic Unilat R; Future - Lipid Profile  3. Urinary incontinence, unspecified incontinence type  - Ambulatory referral to Urology  [facility] Asad A. [facility] 01/16/2015 10:27 AM

## 2015-01-21 ENCOUNTER — Ambulatory Visit: Payer: Self-pay | Attending: Oncology | Admitting: *Deleted

## 2015-01-21 ENCOUNTER — Encounter (INDEPENDENT_AMBULATORY_CARE_PROVIDER_SITE_OTHER): Payer: Self-pay

## 2015-01-21 VITALS — BP 108/75 | HR 71 | Temp 98.0°F | Ht 66.0 in | Wt 165.5 lb

## 2015-01-21 DIAGNOSIS — N63 Unspecified lump in unspecified breast: Secondary | ICD-10-CM

## 2015-01-21 LAB — PAP LB, RFX HPV ASCU: PAP Smear Comment: 0

## 2015-01-22 ENCOUNTER — Encounter: Payer: Self-pay | Admitting: *Deleted

## 2015-01-22 NOTE — Progress Notes (Signed)
Subjective:     Patient ID: Kathryn Guzman, female   DOB: 12/28/1965, 49 y.o.   MRN: [id number]  HPI   Review of Systems     Objective:   Physical Exam  Pulmonary/Chest: Right breast exhibits mass and tenderness. Right breast exhibits no inverted nipple, no nipple discharge and no skin change. Left breast exhibits no inverted nipple, no mass, no nipple discharge, no skin change and no tenderness. Breasts are symmetrical.         Assessment:     49 [facility] White [facility].  States Dr. Brayton ElSyed Shah noted the nodule on exam last week.  On clinical breast exam, bilateral breast have diffuse firm fibroglandular like tissue, making the exam much more difficult.  There is however an area of asymmetric thickening at 7-8:00 right breast at the inframmary ridge.  This area is tender to palpation. Patient states this is also the area of concern noted by Dr. Sherryll BurgerShah.   Taught self breast awareness. Patient has been screened for eligibility.  She does not have any insurance, Medicare or Medicaid.  She also meets financial eligibility.  Hand-out given on the Affordable Care Act.    Plan:      Will schedule patient for unilateral right mammogram and ultrasound since her last mammogram in January 2016 was normal.  Discussed with patient that if no findings on imaging I will refer her for a surgical consultation.  She is agreeable.  Will follow-up per protocol.

## 2015-01-22 NOTE — Patient Instructions (Signed)
Gave patient hand-out, Women Staying Healthy, Active and Well from [facility], with education on breast health, pap smears, heart and colon health. 

## 2015-01-23 ENCOUNTER — Telehealth: Payer: Self-pay | Admitting: Family Medicine

## 2015-01-30 NOTE — Telephone Encounter (Signed)
errenous °

## 2015-02-03 ENCOUNTER — Ambulatory Visit: Payer: PRIVATE HEALTH INSURANCE

## 2015-02-05 ENCOUNTER — Ambulatory Visit
Admission: RE | Admit: 2015-02-05 | Discharge: 2015-02-05 | Disposition: A | Discharge status: Home / Self Care | Payer: Self-pay | Source: Ambulatory Visit | Attending: Oncology | Admitting: Oncology

## 2015-02-05 DIAGNOSIS — N63 Unspecified lump in unspecified breast: Secondary | ICD-10-CM

## 2015-02-11 ENCOUNTER — Telehealth: Payer: Self-pay | Admitting: *Deleted

## 2015-02-11 ENCOUNTER — Encounter: Payer: Self-pay | Admitting: General Surgery

## 2015-02-11 NOTE — Telephone Encounter (Signed)
Called patient to review her normal mammogram results.  We discussed getting a surgical consult since there is a palpable finding.  She is agreeable.  Patient is scheduled to see Dr. Lemar LivingsByrnett on date 02/25/15 at 2:15.  She is to arrive 30 minutes early to complete paperwork.  She is to take a photo ID and all her meds with her to the appointment.  Will follow per Dr. Lemar LivingsByrnett recommendation.  Patient will at least need next mammogram in January for annual screening.

## 2015-02-25 ENCOUNTER — Ambulatory Visit (INDEPENDENT_AMBULATORY_CARE_PROVIDER_SITE_OTHER): Payer: PRIVATE HEALTH INSURANCE | Admitting: General Surgery

## 2015-02-25 ENCOUNTER — Encounter: Payer: Self-pay | Admitting: General Surgery

## 2015-02-25 VITALS — BP 132/82 | HR 80 | Resp 12 | Ht 65.0 in | Wt 178.0 lb

## 2015-02-25 DIAGNOSIS — N6459 Other signs and symptoms in breast: Secondary | ICD-10-CM

## 2015-02-25 NOTE — Patient Instructions (Signed)
Patient to return as needed. 

## 2015-02-25 NOTE — Progress Notes (Signed)
Patient ID: Kathryn Guzman, female   DOB: 08-07-1965, 49 y.o.   MRN: [id number]  Chief Complaint  Patient presents with  . Other    right breast lump    HPI Kathryn MohairJacqueline L Bilyk is a 49 y.o. female. here today for a evaluation of a right breast lump. Last mammogram and right breast ultrasound was done on 02/05/15. Dr. Sherryll BurgerShah felt a lump in her right breast. She has a family history of breast cancer.                                                                      HPI  Past Medical History  Diagnosis Date  . Herpes labialis     Past Surgical History  Procedure Laterality Date  . Tubal ligation  2012  . Abdominal hysterectomy    . Tubal ligation    . Breast cyst aspiration Left     2011 negative    Family History  Problem Relation Age of Onset  . Birth defects Sister     stomach  . Cancer Sister     skin cancer  . Other    . Breast cancer Maternal Grandmother 4570  . Breast cancer Maternal Aunt 40  . Breast cancer Mother     in her 4550's    Social History Social History  Substance Use Topics  . Smoking status: Former Smoker    Quit date: 04/05/2007  . Smokeless tobacco: Never Used  . Alcohol Use: No    No Known Allergies  Current Outpatient Prescriptions  Medication Sig Dispense Refill  . valACYclovir (VALTREX) 500 MG tablet Take 1 tablet (500 mg total) by mouth 2 (two) times daily. 60 tablet 0   No current facility-administered medications for this visit.    Review of Systems Review of Systems  Constitutional: Negative.   Respiratory: Negative.   Cardiovascular: Negative.  Leg swelling:         Blood pressure 132/82, pulse 80, resp. rate 12, height 5\' 5"  (1.651 m), weight 178 lb (80.74 kg).  Physical Exam Physical Exam  Constitutional: She is oriented to person, place, and time. She appears well-developed and well-nourished.  Eyes: Conjunctivae are normal. No scleral icterus.  Neck: Neck supple.  Cardiovascular: Normal rate, regular rhythm and normal  heart sounds.   Pulmonary/Chest: Effort normal and breath sounds normal. Right breast exhibits no inverted nipple, no mass, no nipple discharge, no skin change and no tenderness. Left breast exhibits no inverted nipple, no mass, no nipple discharge, no skin change and no tenderness.    Lymphadenopathy:    She has no cervical adenopathy.    She has no axillary adenopathy.  Neurological: She is alert and oriented to person, place, and time.  Skin: Skin is dry.    Data Reviewed 02/05/2015 unilateral right mammogram and ultrasound reviewed. No interval change reported. No ultrasound abnormality. BI-RADS-1.  Assessment    Focal soft tissue thickening in response.underwire bra, no evidence of primary breast pathology.    Plan    The patient is fairly heavy chested (DD) the underwire is likely contributing to the focal thickening. No intervention is required.    Patient to return as needed.  PCP:  Deborra MedinaShah, Syed Asad A  Jorryn Hershberger, Tinnie GensJeffrey  W 02/26/2015, 6:30 PM

## 2015-02-26 DIAGNOSIS — N6459 Other signs and symptoms in breast: Secondary | ICD-10-CM | POA: Insufficient documentation

## 2015-03-02 ENCOUNTER — Encounter: Payer: Self-pay | Admitting: *Deleted

## 2015-03-02 NOTE — Progress Notes (Signed)
Reviewed office notes from Dr. Lemar LivingsByrnett.  No mass noted. To follow up in one year with annual screening mammogram.  HSIS TO Christy.

## 2015-03-17 ENCOUNTER — Encounter: Payer: Self-pay | Admitting: Obstetrics and Gynecology

## 2015-05-12 ENCOUNTER — Other Ambulatory Visit: Payer: Self-pay | Admitting: *Deleted

## 2015-05-12 DIAGNOSIS — Z0000 Encounter for general adult medical examination without abnormal findings: Secondary | ICD-10-CM

## 2015-05-13 ENCOUNTER — Ambulatory Visit
Admission: RE | Admit: 2015-05-13 | Discharge: 2015-05-13 | Disposition: A | Discharge status: Home / Self Care | Payer: Self-pay | Source: Ambulatory Visit | Attending: Oncology | Admitting: Oncology

## 2015-05-13 ENCOUNTER — Other Ambulatory Visit: Payer: Self-pay | Admitting: *Deleted

## 2015-05-13 DIAGNOSIS — Z0000 Encounter for general adult medical examination without abnormal findings: Secondary | ICD-10-CM

## 2015-05-19 ENCOUNTER — Other Ambulatory Visit: Payer: Self-pay | Admitting: *Deleted

## 2015-05-19 DIAGNOSIS — N63 Unspecified lump in unspecified breast: Secondary | ICD-10-CM

## 2015-05-21 ENCOUNTER — Other Ambulatory Visit: Payer: Self-pay

## 2015-05-21 DIAGNOSIS — N63 Unspecified lump in unspecified breast: Secondary | ICD-10-CM

## 2015-05-25 ENCOUNTER — Ambulatory Visit
Admission: RE | Admit: 2015-05-25 | Discharge: 2015-05-25 | Disposition: A | Discharge status: Home / Self Care | Payer: Self-pay | Source: Ambulatory Visit | Attending: Oncology | Admitting: Oncology

## 2015-05-25 DIAGNOSIS — N63 Unspecified lump in unspecified breast: Secondary | ICD-10-CM

## 2016-01-19 ENCOUNTER — Encounter: Payer: PRIVATE HEALTH INSURANCE | Admitting: Family Medicine

## 2016-03-21 ENCOUNTER — Telehealth: Payer: Self-pay | Admitting: Family Medicine

## 2016-03-21 DIAGNOSIS — B001 Herpesviral vesicular dermatitis: Secondary | ICD-10-CM

## 2016-03-21 MED ORDER — VALACYCLOVIR HCL 500 MG PO TABS: 500.0000 mg | ORAL_TABLET | Freq: Two times a day (BID) | ORAL | 0 refills | Status: DC

## 2016-03-21 NOTE — Telephone Encounter (Signed)
Medication has been refilled and sent to [facility] [address] 

## 2016-03-21 NOTE — Telephone Encounter (Signed)
Patient is requesting refill on Valtrex 500mg . Asking that you please send 90 day supply (it will be less expensive). Please send to [facility]

## 2016-04-13 ENCOUNTER — Other Ambulatory Visit: Payer: Self-pay | Admitting: Family Medicine

## 2016-04-15 ENCOUNTER — Other Ambulatory Visit: Payer: Self-pay | Admitting: Family Medicine

## 2016-04-15 DIAGNOSIS — Z1231 Encounter for screening mammogram for malignant neoplasm of breast: Secondary | ICD-10-CM

## 2016-05-12 ENCOUNTER — Ambulatory Visit (INDEPENDENT_AMBULATORY_CARE_PROVIDER_SITE_OTHER): Payer: BC Managed Care – PPO | Admitting: Family Medicine

## 2016-05-12 ENCOUNTER — Encounter: Payer: Self-pay | Admitting: Family Medicine

## 2016-05-12 VITALS — BP 130/73 | HR 98 | Temp 98.5°F | Resp 17 | Ht 65.0 in | Wt 175.5 lb

## 2016-05-12 DIAGNOSIS — Z0000 Encounter for general adult medical examination without abnormal findings: Secondary | ICD-10-CM

## 2016-05-12 DIAGNOSIS — B0089 Other herpesviral infection: Secondary | ICD-10-CM | POA: Diagnosis not present

## 2016-05-12 DIAGNOSIS — Z23 Encounter for immunization: Secondary | ICD-10-CM

## 2016-05-12 LAB — CBC WITH DIFFERENTIAL/PLATELET
Basophils Absolute: 77 cells/uL (ref 0–200)
Basophils Relative: 1 %
Eosinophils Absolute: 231 cells/uL (ref 15–500)
Eosinophils Relative: 3 %
HCT: 42.4 % (ref 35.0–45.0)
Hemoglobin: 13.9 g/dL (ref 11.7–15.5)
LYMPHS PCT: 37 %
Lymphs Abs: 2849 cells/uL (ref 850–3900)
MCH: 28.5 pg (ref 27.0–33.0)
MCHC: 32.8 g/dL (ref 32.0–36.0)
MCV: 87.1 fL (ref 80.0–100.0)
MONOS PCT: 8 %
MPV: 9.3 fL (ref 7.5–12.5)
Monocytes Absolute: 616 cells/uL (ref 200–950)
Neutro Abs: 3927 cells/uL (ref 1500–7800)
Neutrophils Relative %: 51 %
PLATELETS: 397 10*3/uL (ref 140–400)
RBC: 4.87 MIL/uL (ref 3.80–5.10)
RDW: 13 % (ref 11.0–15.0)
WBC: 7.7 10*3/uL (ref 3.8–10.8)

## 2016-05-12 LAB — LIPID PANEL
Cholesterol: 177 mg/dL (ref <200)
HDL: 40 mg/dL — AB (ref >50)
LDL Cholesterol: 101 mg/dL — ABNORMAL HIGH (ref <100)
TRIGLYCERIDES: 182 mg/dL — AB (ref <150)
Total CHOL/HDL Ratio: 4.4 Ratio (ref <5.0)
VLDL: 36 mg/dL — ABNORMAL HIGH (ref <30)

## 2016-05-12 LAB — COMPLETE METABOLIC PANEL WITH GFR
ALBUMIN: 4.5 g/dL (ref 3.6–5.1)
ALK PHOS: 51 U/L (ref 33–115)
ALT: 15 U/L (ref 6–29)
AST: 17 U/L (ref 10–35)
BILIRUBIN TOTAL: 0.6 mg/dL (ref 0.2–1.2)
BUN: 12 mg/dL (ref 7–25)
CO2: 26 mmol/L (ref 20–31)
CREATININE: 0.81 mg/dL (ref 0.50–1.10)
Calcium: 9.7 mg/dL (ref 8.6–10.2)
Chloride: 103 mmol/L (ref 98–110)
GFR, EST NON AFRICAN AMERICAN: 86 mL/min (ref >=60)
GFR, Est African American: >89 mL/min (ref >=60)
GLUCOSE: 83 mg/dL (ref 65–99)
Potassium: 4.6 mmol/L (ref 3.5–5.3)
SODIUM: 139 mmol/L (ref 135–146)
TOTAL PROTEIN: 7.5 g/dL (ref 6.1–8.1)

## 2016-05-12 LAB — TSH: TSH: 3.01 mIU/L

## 2016-05-12 MED ORDER — VALACYCLOVIR HCL 500 MG PO TABS: 500.0000 mg | ORAL_TABLET | Freq: Every day | ORAL | 1 refills | Status: AC

## 2016-05-12 NOTE — Progress Notes (Signed)
Name: Kathryn Guzman   MRN: [id number]    DOB: 1965-06-17   Date:05/12/2016       Progress Note  Subjective  Chief Complaint  Chief Complaint  Patient presents with  . Annual Exam    CPE    HPI  Pt. Presents for Complete Physical Exam. She sees Theatre managerGynecologist, who is following up on GYN screenings. Not due yet for colonoscopy.    Past Medical History:  Diagnosis Date  . Herpes labialis     Past Surgical History:  Procedure Laterality Date  . ABDOMINAL HYSTERECTOMY    . BREAST CYST ASPIRATION Left    2011 negative  . TUBAL LIGATION  2012  . TUBAL LIGATION      Family History  Problem Relation Age of Onset  . Birth defects Sister     stomach  . Cancer Sister     skin cancer  . Other    . Breast cancer Maternal Grandmother 2470  . Breast cancer Maternal Aunt 2240    Social History   Social History  . Marital status: Married    Spouse name: N/A  . Number of children: N/A  . Years of education: N/A   Occupational History  . Not on file.   Social History Main Topics  . Smoking status: Former Smoker    Quit date: 04/05/2007  . Smokeless tobacco: Never Used  . Alcohol use No  . Drug use: No  . Sexual activity: Yes   Other Topics Concern  . Not on file   Social History Narrative  . No narrative on file     Current Outpatient Prescriptions:  .  valACYclovir (VALTREX) 500 MG tablet, Take 1 tablet (500 mg total) by mouth 2 (two) times daily., Disp: 60 tablet, Rfl: 0  No Known Allergies   Review of Systems  Constitutional: Negative for chills, fever and malaise/fatigue.  HENT: Negative for congestion, ear pain, hearing loss and sore throat.   Eyes: Negative for blurred vision and double vision.  Respiratory: Negative for cough, sputum production and shortness of breath.   Cardiovascular: Negative for chest pain, palpitations and leg swelling.  Gastrointestinal: Negative for abdominal pain, blood in stool, constipation, diarrhea, nausea and vomiting.   Genitourinary: Negative for dysuria and hematuria.  Musculoskeletal: Negative for back pain, myalgias and neck pain.  Skin: Negative for rash.  Neurological: Negative for headaches.  Psychiatric/Behavioral: Negative for depression. The patient is not nervous/anxious and does not have insomnia.     Objective  Vitals:   05/12/16 1102  BP: 130/73  Pulse: 98  Resp: 17  Temp: 98.5 F (36.9 C)  TempSrc: Oral  SpO2: 96%  Weight: 175 lb 8 oz (79.6 kg)  Height: 5\' 5"  (1.651 m)    Physical Exam  Constitutional: She is oriented to person, place, and time and well-developed, well-nourished, and in no distress.  HENT:  Head: Normocephalic and atraumatic.  Right Ear: Tympanic membrane and ear canal normal.  Left Ear: Tympanic membrane and ear canal normal.  Mouth/Throat: Oropharynx is clear and moist. No posterior oropharyngeal erythema.  Cardiovascular: Normal rate, regular rhythm, S1 normal, S2 normal and normal heart sounds.   No murmur heard. Pulmonary/Chest: Effort normal and breath sounds normal. She has no wheezes. She has no rhonchi.  Abdominal: Soft. Bowel sounds are normal. There is no tenderness.  Neurological: She is alert and oriented to person, place, and time.  Skin: Skin is warm and dry.  Psychiatric: Mood, memory, affect and judgment normal.  Nursing note and vitals reviewed.        Assessment & Plan  1. Well woman exam without gynecological exam Obtain age-appropriate laboratory workup - CBC with Differential - Lipid Profile - COMPLETE METABOLIC PANEL WITH GFR - TSH - Vitamin D (25 hydroxy)  2. Recurrent oral herpes simplex infection Recurrent aggressive outbreaks, will start on suppression therapy with Valtrex 500 mg taken every day, reassess in 6 months. - valACYclovir (VALTREX) 500 MG tablet; Take 1 tablet (500 mg total) by mouth daily.  Dispense: 180 tablet; Refill: 1   3. Immunization due  - Tdap vaccine greater than or equal to 7yo IM   [employer] A[facility] 05/12/2016 11:16 AM

## 2016-05-13 ENCOUNTER — Ambulatory Visit
Admission: RE | Admit: 2016-05-13 | Discharge: 2016-05-13 | Disposition: A | Discharge status: Home / Self Care | Payer: BC Managed Care – PPO | Source: Ambulatory Visit | Attending: Family Medicine | Admitting: Family Medicine

## 2016-05-13 DIAGNOSIS — Z1231 Encounter for screening mammogram for malignant neoplasm of breast: Secondary | ICD-10-CM | POA: Insufficient documentation

## 2016-05-13 LAB — VITAMIN D 25 HYDROXY (VIT D DEFICIENCY, FRACTURES): Vit D, 25-Hydroxy: 32 ng/mL (ref 30–100)

## 2016-05-25 ENCOUNTER — Ambulatory Visit: Payer: Self-pay

## 2016-07-21 IMAGING — US US BREAST LTD UNI LEFT INC AXILLA
1 series · 6 of 6 positions shown · non-contrast
Comparison: Multiple prior exams dating back to 06/26/2008

CLINICAL DATA: 49-year-old female, callback from screening
mammogram for possible left breast mass

EXAM:
DIGITAL DIAGNOSTIC LEFT MAMMOGRAM WITH 3D TOMOSYNTHESIS
ULTRASOUND LEFT BREAST

[Series 1: us breast ltd uni left inc axilla · 0.06mm/px · 6 of 6 slices shown]
[im 1/6]
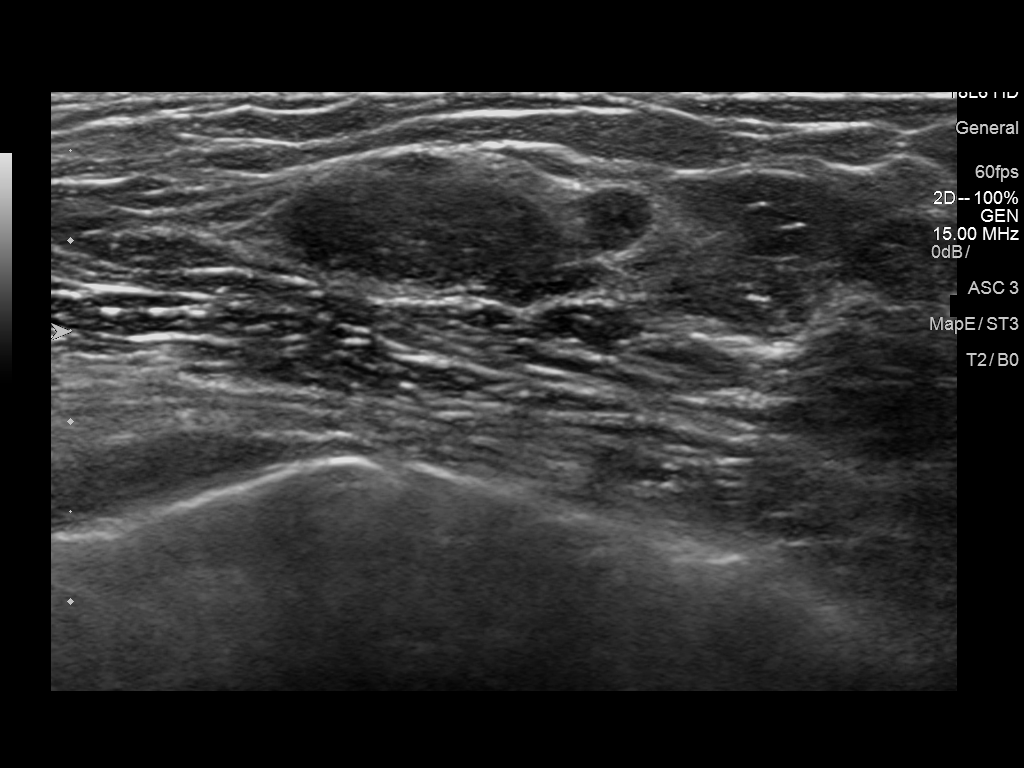
[im 2/6]
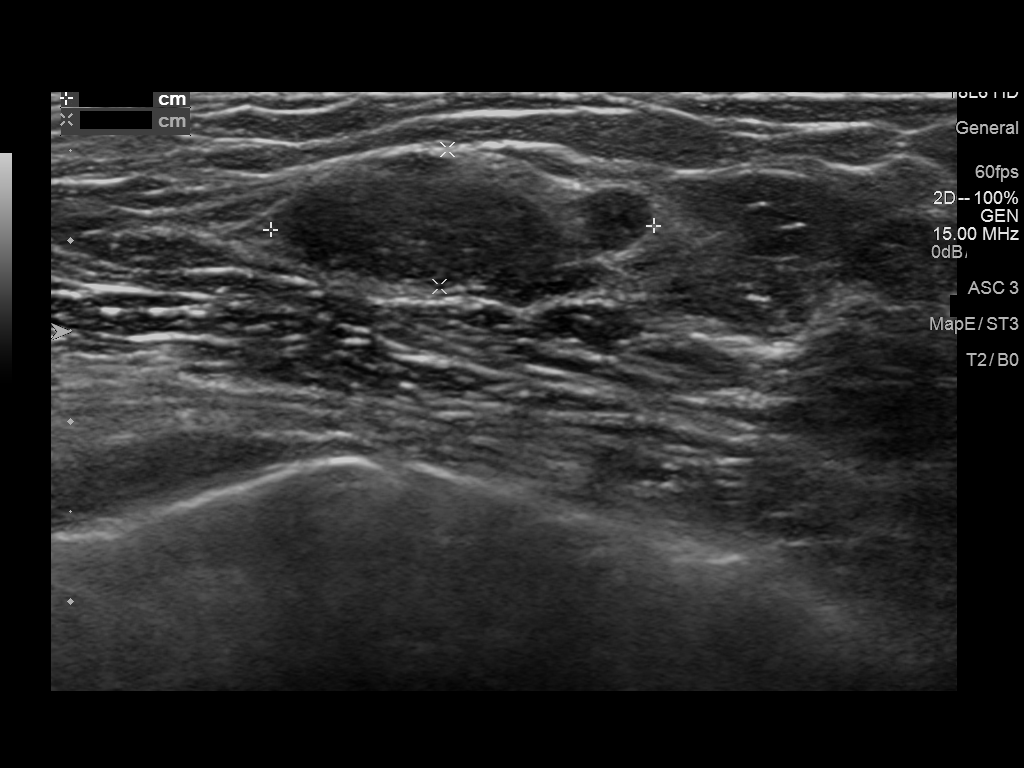
[im 3/6]
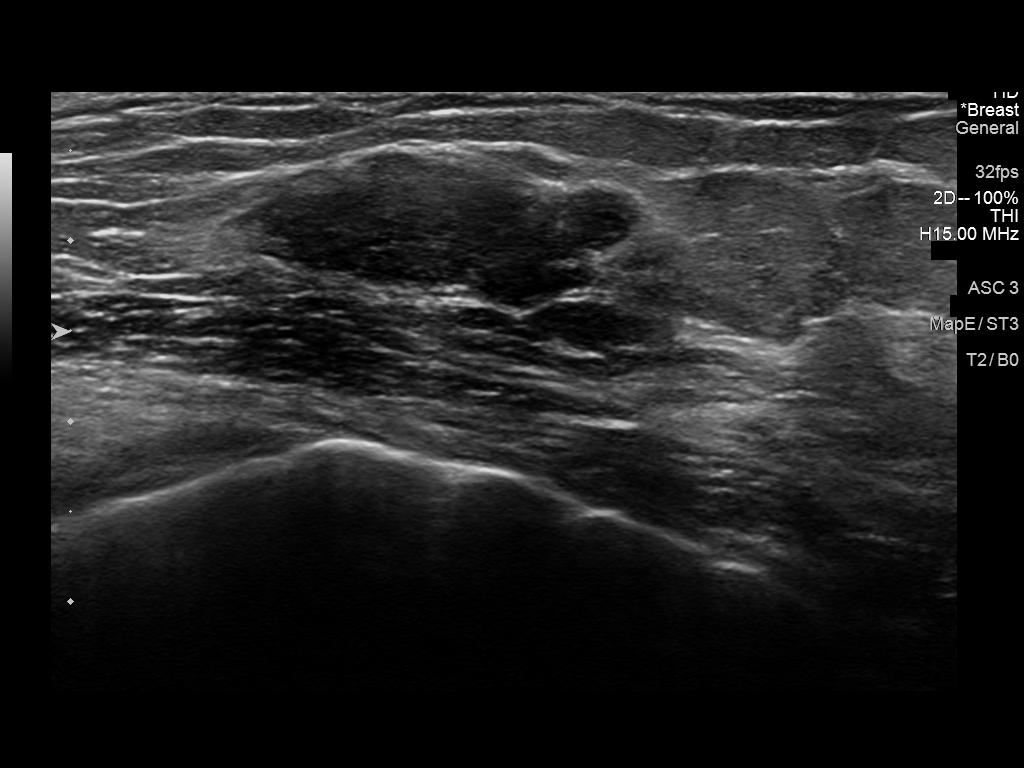
[im 4/6]
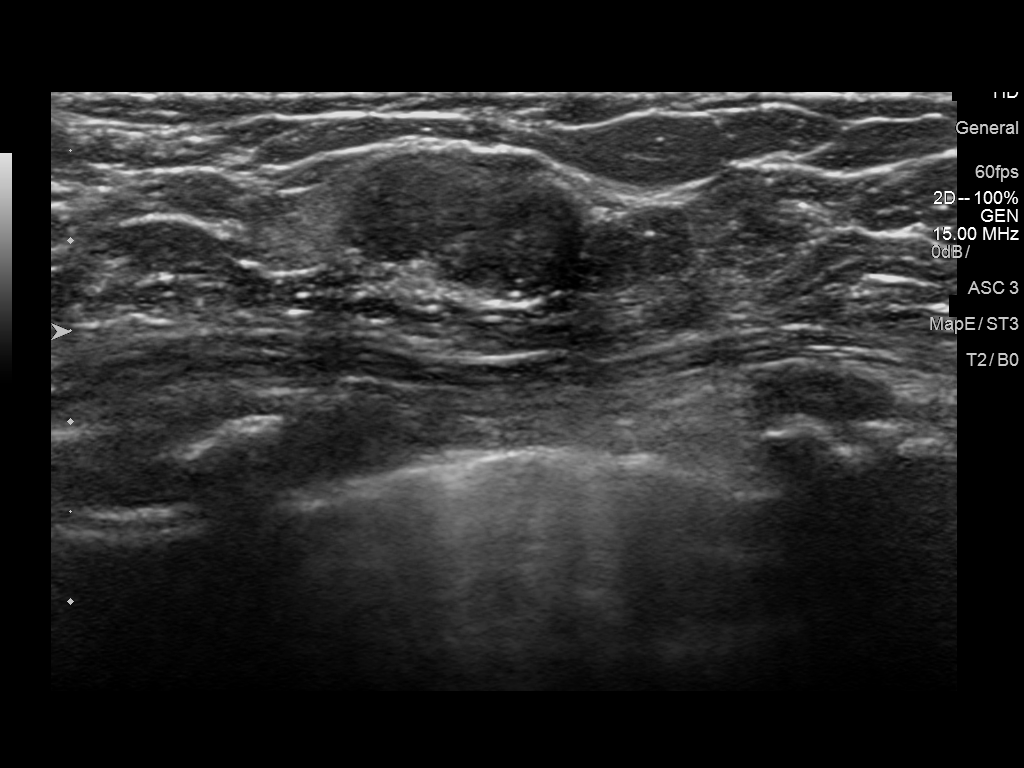
[im 5/6]
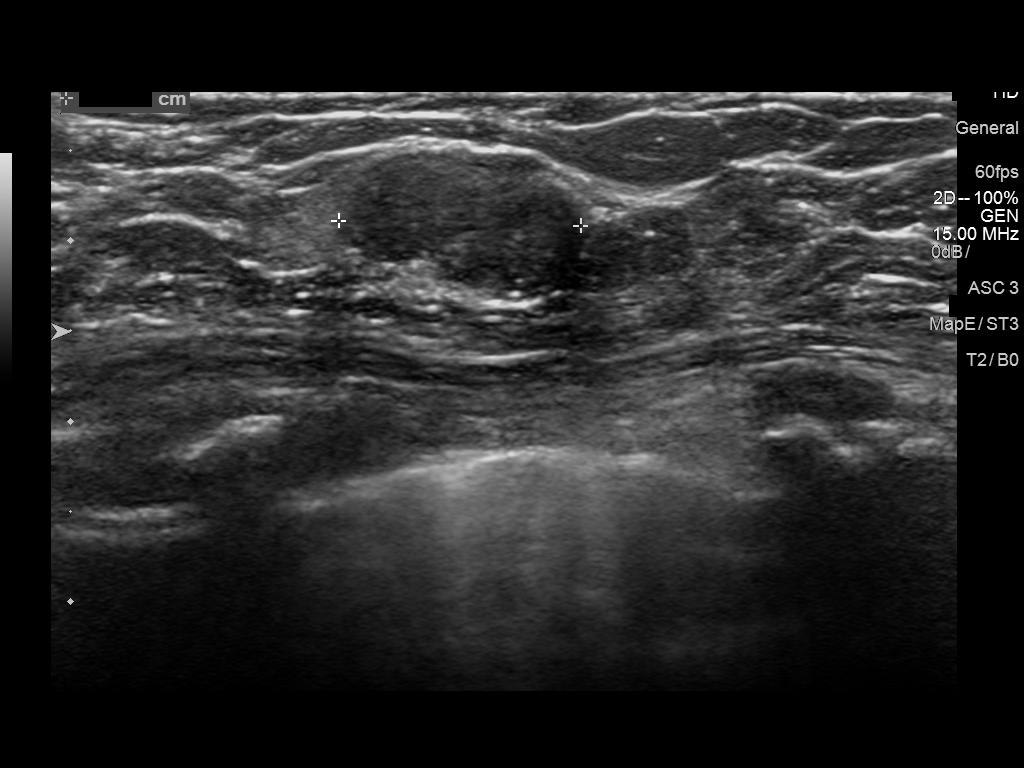
[im 6/6]
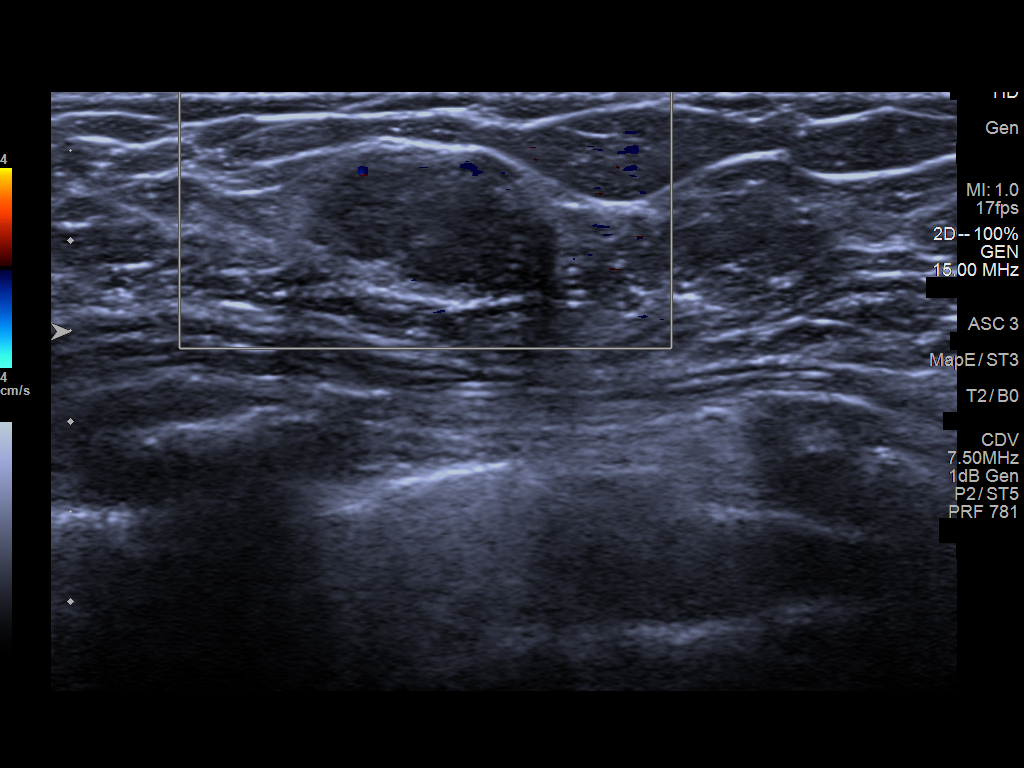

[6 of 6 positions shown; findings below may reference images not displayed]

ACR Breast Density Category c: The breast tissue is heterogeneously
dense, which may obscure small masses.
FINDINGS: Cc and MLO spot-compression views of the left breast with with
tomosynthesis were performed. On the additional views, an oval,
circumscribed mass is identified within the far medial left breast
measuring up to 2.3 cm. This is not appear significantly changed
from the patient's 9133 examination.

On physical exam, there is a mobile, palpable mass at 10 o'clock, 13
cm from the nipple, corresponding to the mammographic finding.

Targeted ultrasound is performed, showing an oval, circumscribed,
hypoechoic mass at 10 o'clock, 13 cm from the nipple measuring 2.1 x
0.8 x 1.3 cm. This is is not significantly changed from the
patient's 9133 ultrasound. Biopsy of this mass in October 2009
demonstrated benign tissue with focal fibroadenomatous change. No
atypia or malignancy was seen.
IMPRESSION: Benign right breast mass. No mammographic or sonographic evidence of
malignancy.

RECOMMENDATION:
Screening mammogram in one year.(Code:T0-F-FE7)

I have discussed the findings and recommendations with the patient.
Results were also provided in writing at the conclusion of the
visit. If applicable, a reminder letter will be sent to the patient
regarding the next appointment.

BI-RADS CATEGORY  2: Benign.

## 2017-05-15 ENCOUNTER — Encounter: Payer: BC Managed Care – PPO | Admitting: Family Medicine
